# Patient Record
Sex: Male | Born: 1970 | Race: White | Hispanic: No | Marital: Married | Smoking: Never smoker
Health system: Southern US, Community
[De-identification: ages and names within clinical notes are randomized; demographics above are authoritative.]

## PROBLEM LIST (undated history)

## (undated) DIAGNOSIS — J302 Other seasonal allergic rhinitis: Secondary | ICD-10-CM

## (undated) DIAGNOSIS — R079 Chest pain, unspecified: Secondary | ICD-10-CM

## (undated) DIAGNOSIS — I10 Essential (primary) hypertension: Secondary | ICD-10-CM

## (undated) HISTORY — DX: Chest pain, unspecified: R07.9

## (undated) HISTORY — DX: Other seasonal allergic rhinitis: J30.2

## (undated) HISTORY — PX: TONSILLECTOMY: SUR1361

## (undated) HISTORY — DX: Essential (primary) hypertension: I10

---

## 2019-09-02 DIAGNOSIS — E559 Vitamin D deficiency, unspecified: Secondary | ICD-10-CM | POA: Insufficient documentation

## 2019-12-05 ENCOUNTER — Other Ambulatory Visit: Payer: Self-pay | Admitting: Gastroenterology

## 2019-12-05 DIAGNOSIS — R748 Abnormal levels of other serum enzymes: Secondary | ICD-10-CM

## 2019-12-10 ENCOUNTER — Other Ambulatory Visit: Payer: Self-pay

## 2019-12-10 ENCOUNTER — Ambulatory Visit
Admission: RE | Admit: 2019-12-10 | Discharge: 2019-12-10 | Disposition: A | Discharge status: Home / Self Care | Payer: Managed Care, Other (non HMO) | Source: Ambulatory Visit | Attending: Gastroenterology | Admitting: Gastroenterology

## 2019-12-10 DIAGNOSIS — R748 Abnormal levels of other serum enzymes: Secondary | ICD-10-CM | POA: Diagnosis not present

## 2019-12-11 ENCOUNTER — Ambulatory Visit: Payer: Self-pay

## 2020-03-01 DIAGNOSIS — L989 Disorder of the skin and subcutaneous tissue, unspecified: Secondary | ICD-10-CM | POA: Insufficient documentation

## 2020-11-17 DIAGNOSIS — M545 Low back pain, unspecified: Secondary | ICD-10-CM | POA: Insufficient documentation

## 2021-05-15 ENCOUNTER — Other Ambulatory Visit: Payer: Self-pay

## 2021-05-15 ENCOUNTER — Ambulatory Visit
Admit: 2021-05-15 | Discharge: 2021-05-15 | Disposition: A | Discharge status: Home / Self Care | Payer: Managed Care, Other (non HMO) | Source: Ambulatory Visit | Attending: Emergency Medicine | Admitting: Emergency Medicine

## 2021-05-15 DIAGNOSIS — M25562 Pain in left knee: Secondary | ICD-10-CM | POA: Insufficient documentation

## 2021-05-15 DIAGNOSIS — Z880 Allergy status to penicillin: Secondary | ICD-10-CM | POA: Diagnosis not present

## 2021-05-15 DIAGNOSIS — M25561 Pain in right knee: Secondary | ICD-10-CM | POA: Diagnosis not present

## 2021-05-15 DIAGNOSIS — J069 Acute upper respiratory infection, unspecified: Secondary | ICD-10-CM | POA: Diagnosis not present

## 2021-05-15 DIAGNOSIS — Z20822 Contact with and (suspected) exposure to covid-19: Secondary | ICD-10-CM | POA: Insufficient documentation

## 2021-05-15 DIAGNOSIS — R42 Dizziness and giddiness: Secondary | ICD-10-CM | POA: Insufficient documentation

## 2021-05-15 DIAGNOSIS — Z881 Allergy status to other antibiotic agents status: Secondary | ICD-10-CM | POA: Diagnosis not present

## 2021-05-15 DIAGNOSIS — R509 Fever, unspecified: Secondary | ICD-10-CM | POA: Insufficient documentation

## 2021-05-15 LAB — INFLUENZA A AND B ANTIGEN (CONVERTED LAB)
INFLUENZA A ANTIGEN, POC: NEGATIVE
INFLUENZA B ANTIGEN, POC: NEGATIVE

## 2021-05-15 MED ORDER — IPRATROPIUM BROMIDE 0.06 % NA SOLN: 2.0000 | Freq: Four times a day (QID) | NASAL | 12 refills | Status: DC

## 2021-05-15 MED ORDER — PROMETHAZINE-DM 6.25-15 MG/5ML PO SYRP: 5.0000 mL | ORAL_SOLUTION | Freq: Four times a day (QID) | ORAL | 0 refills | Status: DC | PRN

## 2021-05-15 MED ORDER — BENZONATATE 100 MG PO CAPS
200.0000 mg | ORAL_CAPSULE | Freq: Three times a day (TID) | ORAL | 0 refills | Status: DC
Start: 2021-05-15 — End: 2021-12-28

## 2021-05-15 NOTE — Discharge Instructions (Addendum)
Isolate at home pending the results of your COVID test.  If you test positive then you will have to quarantine for 5 days from the start of your symptoms.  After 5 days you can break quarantine if your symptoms have improved and you have not had a fever for 24 hours without taking Tylenol or ibuprofen.  Use over-the-counter Tylenol and ibuprofen as needed for body aches and fever.  Use the Tessalon Perles during the day as needed for cough and the Promethazine DM cough syrup at nighttime as will make you drowsy.  Use the Atrovent nasal spray, 2 squirts up each nostril every 6 hours, as needed for nasal congestion.  Increase your oral fluid intake to goal of a gallon a day till help improve hydration which may help with your feeling of being off balance and also with the dark-colored urine.  If you develop any increased shortness of breath-especially at rest, you are unable to speak in full sentences, or is a late sign your lips are turning blue you need to go the ER for evaluation.

## 2021-05-15 NOTE — ED Triage Notes (Signed)
Pt c/o dizziness, joint pain, fever 102, all started yesterday. Pt denies n/v/d or other symptoms. Pt does report change to his urine color, denies other urinary symptoms.

## 2021-05-15 NOTE — ED Provider Notes (Addendum)
[facility]    CSN: [id number] Arrival date & time: 05/15/21  7371      History   Chief Complaint Chief Complaint  Patient presents with   Dizziness   Fever    HPI Sergio Weeks. is a 50 y.o. male.   HPI  50 year old male here for evaluation of multiple complaints.  Patient reports that he developed dizziness, pain in his knees and ankles, and darker yellow urine that started yesterday.  He states when he left the house this morning his fever was 102.  He has not take anything for his fever and his temperature is currently 98.3 in clinic.  He states that he has had what he describes as dizziness but when asked to clarify he says this more like he his head is stopped up.  There is no room spinning or feeling of being off balance.  He is also had a nonproductive cough which she is attributing to allergies as he gets the same this time a year.  He denies any runny nose nasal congestion, sore throat, shortness breath or wheezing, or GI complaints.  He denies any painful urination or urinary urgency or frequency.  The "dizziness" is not affected by change in position or movement of his head.  Patient shakes his head up and down, and side to side, when answering questions without any exacerbation of symptoms.  History reviewed. No pertinent past medical history.  There are no problems to display for this patient.   History reviewed. No pertinent surgical history.     Home Medications    Prior to Admission medications   Medication Sig Start Date End Date Taking? Authorizing Provider  benzonatate (TESSALON) 100 MG capsule Take 2 capsules (200 mg total) by mouth every 8 (eight) hours. 05/15/21  Yes Becky Augusta, NP  Cholecalciferol 50 MCG (2000 UT) CAPS Take 3 capsules daily for 3 months, then reduce to 1 capsule daily thereafter for Vitamin D Deficiency. 10/29/20  Yes [provider]  ipratropium (ATROVENT) 0.06 % nasal spray Place 2 sprays into both  nostrils 4 (four) times daily. 05/15/21  Yes Becky Augusta, NP  losartan (COZAAR) 25 MG tablet Take 1 tablet by mouth daily. 01/20/21 01/20/22 Yes [provider]  promethazine-dextromethorphan (PROMETHAZINE-DM) 6.25-15 MG/5ML syrup Take 5 mLs by mouth 4 (four) times daily as needed. 05/15/21  Yes Becky Augusta, NP    Family History History reviewed. No pertinent family history.  Social History Social History   Tobacco Use   Smoking status: Never   Smokeless tobacco: Never  Vaping Use   Vaping Use: Never used     Allergies   Cefaclor and Penicillin v   Review of Systems Review of Systems  Constitutional:  Positive for fever. Negative for activity change and appetite change.  HENT:  Negative for congestion, rhinorrhea and sore throat.   Respiratory:  Positive for cough. Negative for shortness of breath and wheezing.   Gastrointestinal:  Negative for diarrhea, nausea and vomiting.  Genitourinary:  Negative for decreased urine volume, difficulty urinating, dysuria, frequency, hematuria and urgency.  Skin:  Negative for rash.  Neurological:  Positive for light-headedness.  Hematological: Negative.   Psychiatric/Behavioral: Negative.      Physical Exam Triage Vital Signs ED Triage Vitals  Enc Vitals Group     BP 05/15/21 0840 123/78     Pulse Rate 05/15/21 0840 94     Resp 05/15/21 0840 18     Temp 05/15/21 0840 98.3 F (  36.8 C)     Temp Source 05/15/21 0840 Oral     SpO2 05/15/21 0840 99 %     Weight 05/15/21 0838 282 lb (127.9 kg)     Height 05/15/21 0838 6\' 2"  (1.88 m)     Head Circumference --      Peak Flow --      Pain Score 05/15/21 0838 0     Pain Loc --      Pain Edu? --      Excl. in GC? --    No data found.  Updated Vital Signs BP 123/78 (BP Location: Left Arm)   Pulse 94   Temp 98.3 F (36.8 C) (Oral)   Resp 18   Ht 6\' 2"  (1.88 m)   Wt 282 lb (127.9 kg)   SpO2 99%   BMI 36.21 kg/m   Visual Acuity Right Eye Distance:   Left Eye  Distance:   Bilateral Distance:    Right Eye Near:   Left Eye Near:    Bilateral Near:     Physical Exam Vitals and nursing note reviewed.  Constitutional:      General: He is not in acute distress.    Appearance: Normal appearance. He is not ill-appearing.  HENT:     Head: Normocephalic and atraumatic.     Right Ear: Tympanic membrane, ear canal and external ear normal. There is no impacted cerumen.     Left Ear: Tympanic membrane, ear canal and external ear normal. There is no impacted cerumen.     Ears:     Comments: No tenderness or change of symptomology with palpation of bilateral eustachian tubes externally.    Nose: Congestion present. No rhinorrhea.     Mouth/Throat:     Mouth: Mucous membranes are moist.     Pharynx: Oropharynx is clear. Posterior oropharyngeal erythema present.  Eyes:     General: No scleral icterus.    Extraocular Movements: Extraocular movements intact.     Pupils: Pupils are equal, round, and reactive to light.  Cardiovascular:     Rate and Rhythm: Normal rate and regular rhythm.     Pulses: Normal pulses.     Heart sounds: Normal heart sounds. No murmur heard.   No gallop.  Pulmonary:     Breath sounds: Normal breath sounds. No wheezing, rhonchi or rales.  Musculoskeletal:     Cervical back: Normal range of motion and neck supple.  Lymphadenopathy:     Cervical: No cervical adenopathy.  Skin:    General: Skin is warm and dry.     Capillary Refill: Capillary refill takes less than 2 seconds.     Findings: No erythema or rash.  Neurological:     General: No focal deficit present.     Mental Status: He is alert and oriented to person, place, and time.  Psychiatric:        Mood and Affect: Mood normal.        Behavior: Behavior normal.        Thought Content: Thought content normal.        Judgment: Judgment normal.     UC Treatments / Results  Labs (all labs ordered are listed, but only abnormal results are displayed) Labs Reviewed   SARS CORONAVIRUS 2 (TAT 6-24 HRS)  INFLUENZA A AND B ANTIGEN (CONVERTED LAB)  POC INFLUENZA A AND B ANTIGEN (URGENT CARE ONLY)    EKG   Radiology No results found.  Procedures Procedures (including critical care time)  Medications Ordered in UC Medications - No data to display  Initial Impression / Assessment and Plan / UC Course  I have reviewed the triage vital signs and the nursing notes.  Pertinent labs & imaging results that were available during my care of the patient were reviewed by me and considered in my medical decision making (see chart for details).  50 year old male here for evaluation of flulike symptoms that began yesterday and include with the patient is calling "dizziness".  Upon further clarification this is another feeling of being off balance or room spinning.  It is not affected by change in position or movement of his head.  He just feels a little "off".  He is also complaining of his left ear feeling stopped up.  His other symptoms include pain in his knees and ankles, both which she has had previous injuries and, a nonproductive cough, and a darker yellow urine.  There is no dysuria, urgency, frequency associated with the darker urine.  Patient's physical exam reveals bilateral pearly gray tympanic membranes with a normal light reflex and clear external auditory canals.  Patient has no tenderness with external palpation of bilateral eustachian tubes.  Nasal mucosa is mildly edematous and erythematous with some dried yellow mucus in both naris but no discharge.  Oropharyngeal exam reveals mild posterior oropharyngeal erythema with clear postnasal drip.  No cervical lymphadenopathy appreciated exam.  Cardiopulmonary exam reveals clear lung sounds in all fields.  Patient states that his daughter is an Electrical engineer and she evaluated him yesterday and expressed that her concern was he has COVID.  Patient reports that his body aches feels similar to when he has had  the flu in the past.  Will swab patient for COVID and flu.  Patient symptoms are most consistent with possible COVID.  Point-of-care influenza is.  COVID swab is pending.  Will discharge patient home to isolate pending results of his COVID test.  Given the fact that patient has had a mild nonproductive cough we will give Tessalon Perles and Promethazine DM cough syrup.  We will also give Atrovent nasal spray to help with nasal congestion and this may also help with the patient's dizziness.  It is unclear that patient is in fact having dizziness but I think it is more to do with nasal congestion and upper airway inflammation.  Patient vies to follow-up in the ER if there is any change of symptoms.  Work note provided.   Final Clinical Impressions(s) / UC Diagnoses   Final diagnoses:  Upper respiratory tract infection, unspecified type     Discharge Instructions      Isolate at home pending the results of your COVID test.  If you test positive then you will have to quarantine for 5 days from the start of your symptoms.  After 5 days you can break quarantine if your symptoms have improved and you have not had a fever for 24 hours without taking Tylenol or ibuprofen.  Use over-the-counter Tylenol and ibuprofen as needed for body aches and fever.  Use the Tessalon Perles during the day as needed for cough and the Promethazine DM cough syrup at nighttime as will make you drowsy.  Use the Atrovent nasal spray, 2 squirts up each nostril every 6 hours, as needed for nasal congestion.  Increase your oral fluid intake to goal of a gallon a day till help improve hydration which may help with your feeling of being off balance and also with the dark-colored urine.  If you develop any increased shortness of breath-especially at rest, you are unable to speak in full sentences, or is a late sign your lips are turning blue you need to go the ER for evaluation.      ED Prescriptions     Medication  Sig Dispense Auth. Provider   benzonatate (TESSALON) 100 MG capsule Take 2 capsules (200 mg total) by mouth every 8 (eight) hours. 21 capsule Becky Augusta, NP   ipratropium (ATROVENT) 0.06 % nasal spray Place 2 sprays into both nostrils 4 (four) times daily. 15 mL Becky Augusta, NP   promethazine-dextromethorphan (PROMETHAZINE-DM) 6.25-15 MG/5ML syrup Take 5 mLs by mouth 4 (four) times daily as needed. 118 mL Becky Augusta, NP      PDMP not reviewed this encounter.   Becky Augusta, NP 05/15/21 4540    Becky Augusta, NP 05/15/21 [phone]

## 2021-05-16 LAB — SARS CORONAVIRUS 2 (TAT 6-24 HRS): SARS Coronavirus 2: NEGATIVE

## 2021-07-15 DIAGNOSIS — G4739 Other sleep apnea: Secondary | ICD-10-CM | POA: Insufficient documentation

## 2021-07-16 DIAGNOSIS — I700 Atherosclerosis of aorta: Secondary | ICD-10-CM | POA: Insufficient documentation

## 2021-08-18 ENCOUNTER — Other Ambulatory Visit (HOSPITAL_COMMUNITY): Payer: Self-pay | Admitting: Neurosurgery

## 2021-08-18 ENCOUNTER — Other Ambulatory Visit: Payer: Self-pay | Admitting: Neurosurgery

## 2021-08-29 ENCOUNTER — Other Ambulatory Visit: Payer: Self-pay | Admitting: Neurosurgery

## 2021-08-29 DIAGNOSIS — M5136 Other intervertebral disc degeneration, lumbar region: Secondary | ICD-10-CM

## 2021-08-29 DIAGNOSIS — M5416 Radiculopathy, lumbar region: Secondary | ICD-10-CM

## 2021-09-06 ENCOUNTER — Ambulatory Visit
Admission: RE | Admit: 2021-09-06 | Discharge: 2021-09-06 | Disposition: A | Discharge status: Home / Self Care | Payer: Managed Care, Other (non HMO) | Source: Ambulatory Visit | Attending: Neurosurgery | Admitting: Neurosurgery

## 2021-09-06 ENCOUNTER — Other Ambulatory Visit: Payer: Self-pay

## 2021-09-06 DIAGNOSIS — M5416 Radiculopathy, lumbar region: Secondary | ICD-10-CM | POA: Insufficient documentation

## 2021-09-06 DIAGNOSIS — M5136 Other intervertebral disc degeneration, lumbar region: Secondary | ICD-10-CM | POA: Insufficient documentation

## 2021-11-23 DIAGNOSIS — R770 Abnormality of albumin: Secondary | ICD-10-CM | POA: Insufficient documentation

## 2021-12-07 ENCOUNTER — Other Ambulatory Visit: Payer: Self-pay | Admitting: Physician Assistant

## 2021-12-07 ENCOUNTER — Other Ambulatory Visit: Payer: Self-pay | Admitting: Gerontology

## 2021-12-07 ENCOUNTER — Other Ambulatory Visit (HOSPITAL_COMMUNITY): Payer: Self-pay | Admitting: Physician Assistant

## 2021-12-07 DIAGNOSIS — R5383 Other fatigue: Secondary | ICD-10-CM

## 2021-12-07 DIAGNOSIS — K529 Noninfective gastroenteritis and colitis, unspecified: Secondary | ICD-10-CM

## 2021-12-07 DIAGNOSIS — R634 Abnormal weight loss: Secondary | ICD-10-CM

## 2021-12-07 DIAGNOSIS — D729 Disorder of white blood cells, unspecified: Secondary | ICD-10-CM

## 2021-12-07 DIAGNOSIS — R748 Abnormal levels of other serum enzymes: Secondary | ICD-10-CM

## 2021-12-07 DIAGNOSIS — R519 Headache, unspecified: Secondary | ICD-10-CM

## 2021-12-07 DIAGNOSIS — R42 Dizziness and giddiness: Secondary | ICD-10-CM

## 2021-12-14 ENCOUNTER — Inpatient Hospital Stay: Payer: Managed Care, Other (non HMO) | Admitting: Oncology

## 2021-12-14 ENCOUNTER — Inpatient Hospital Stay: Payer: Managed Care, Other (non HMO)

## 2021-12-28 ENCOUNTER — Other Ambulatory Visit: Payer: Self-pay

## 2021-12-28 ENCOUNTER — Inpatient Hospital Stay: Payer: Managed Care, Other (non HMO)

## 2021-12-28 ENCOUNTER — Encounter: Payer: Self-pay | Admitting: Oncology

## 2021-12-28 ENCOUNTER — Inpatient Hospital Stay: Payer: Managed Care, Other (non HMO) | Attending: Oncology | Admitting: Oncology

## 2021-12-28 VITALS — BP 118/80 | HR 75 | Temp 97.8°F | Resp 18 | Wt 229.7 lb

## 2021-12-28 DIAGNOSIS — D72819 Decreased white blood cell count, unspecified: Secondary | ICD-10-CM | POA: Insufficient documentation

## 2021-12-28 DIAGNOSIS — R634 Abnormal weight loss: Secondary | ICD-10-CM | POA: Diagnosis not present

## 2021-12-28 DIAGNOSIS — R5383 Other fatigue: Secondary | ICD-10-CM | POA: Diagnosis not present

## 2021-12-28 DIAGNOSIS — D539 Nutritional anemia, unspecified: Secondary | ICD-10-CM

## 2021-12-28 DIAGNOSIS — Z833 Family history of diabetes mellitus: Secondary | ICD-10-CM | POA: Diagnosis not present

## 2021-12-28 DIAGNOSIS — D7589 Other specified diseases of blood and blood-forming organs: Secondary | ICD-10-CM | POA: Insufficient documentation

## 2021-12-28 DIAGNOSIS — Z79899 Other long term (current) drug therapy: Secondary | ICD-10-CM | POA: Insufficient documentation

## 2021-12-28 DIAGNOSIS — R748 Abnormal levels of other serum enzymes: Secondary | ICD-10-CM | POA: Diagnosis not present

## 2021-12-28 DIAGNOSIS — R6881 Early satiety: Secondary | ICD-10-CM | POA: Diagnosis not present

## 2021-12-28 LAB — CBC WITH DIFFERENTIAL/PLATELET
Abs Immature Granulocytes: 0.06 10*3/uL (ref 0.00–0.07)
Basophils Absolute: 0.1 10*3/uL (ref 0.0–0.1)
Basophils Relative: 2 %
Eosinophils Absolute: 0.3 10*3/uL (ref 0.0–0.5)
Eosinophils Relative: 4 %
HCT: 40.2 % (ref 39.0–52.0)
Hemoglobin: 13.9 g/dL (ref 13.0–17.0)
Immature Granulocytes: 1 %
Lymphocytes Relative: 22 %
Lymphs Abs: 1.3 10*3/uL (ref 0.7–4.0)
MCH: 37.7 pg — ABNORMAL HIGH (ref 26.0–34.0)
MCHC: 34.6 g/dL (ref 30.0–36.0)
MCV: 108.9 fL — ABNORMAL HIGH (ref 80.0–100.0)
Monocytes Absolute: 1 10*3/uL (ref 0.1–1.0)
Monocytes Relative: 16 %
Neutro Abs: 3.4 10*3/uL (ref 1.7–7.7)
Neutrophils Relative %: 55 %
Platelets: 150 10*3/uL (ref 150–400)
RBC: 3.69 MIL/uL — ABNORMAL LOW (ref 4.22–5.81)
RDW: 13.3 % (ref 11.5–15.5)
WBC: 6.2 10*3/uL (ref 4.0–10.5)
nRBC: 0 % (ref 0.0–0.2)

## 2021-12-28 LAB — HEPATIC FUNCTION PANEL
ALT: 42 U/L (ref 0–44)
AST: 60 U/L — ABNORMAL HIGH (ref 15–41)
Albumin: 2.6 g/dL — ABNORMAL LOW (ref 3.5–5.0)
Alkaline Phosphatase: 90 U/L (ref 38–126)
Bilirubin, Direct: 1.3 mg/dL — ABNORMAL HIGH (ref 0.0–0.2)
Indirect Bilirubin: 5.3 mg/dL — ABNORMAL HIGH (ref 0.3–0.9)
Total Bilirubin: 6.6 mg/dL — ABNORMAL HIGH (ref 0.3–1.2)
Total Protein: 6.5 g/dL (ref 6.5–8.1)

## 2021-12-28 LAB — HEPATITIS PANEL, ACUTE
HCV Ab: NONREACTIVE
Hep A IgM: NONREACTIVE
Hep B C IgM: NONREACTIVE
Hepatitis B Surface Ag: NONREACTIVE

## 2021-12-28 LAB — RETIC PANEL
Immature Retic Fract: 7.4 % (ref 2.3–15.9)
RBC.: 3.64 MIL/uL — ABNORMAL LOW (ref 4.22–5.81)
Retic Count, Absolute: 102.3 10*3/uL (ref 19.0–186.0)
Retic Ct Pct: 2.8 % (ref 0.4–3.1)
Reticulocyte Hemoglobin: 41.1 pg (ref >27.9)

## 2021-12-28 LAB — TSH: TSH: 3.904 u[IU]/mL (ref 0.350–4.500)

## 2021-12-28 LAB — FERRITIN: Ferritin: 686 ng/mL — ABNORMAL HIGH (ref 24–336)

## 2021-12-28 LAB — IRON AND TIBC
Iron: 205 ug/dL — ABNORMAL HIGH (ref 45–182)
Saturation Ratios: 92 % — ABNORMAL HIGH (ref 17.9–39.5)
TIBC: 224 ug/dL — ABNORMAL LOW (ref 250–450)
UIBC: 19 ug/dL

## 2021-12-28 LAB — LACTATE DEHYDROGENASE: LDH: 217 U/L — ABNORMAL HIGH (ref 98–192)

## 2021-12-28 NOTE — Addendum Note (Signed)
Addended by: Earlie Server on: 12/28/2021 08:38 PM   Modules accepted: Orders

## 2021-12-28 NOTE — Progress Notes (Addendum)
Hematology/Oncology Consult note [phone] [phone]         Patient Care Team: Latanya Maudlin, NP as PCP - General (Family Medicine)  REFERRING PROVIDER: Latanya Maudlin, NP  CHIEF COMPLAINTS/REASON FOR VISIT:  Evaluation of decreased white count  HISTORY OF PRESENTING ILLNESS:   Sergio Sitzer. is a  51 y.o.  male with PMH listed below was seen in consultation at the request of  Latanya Maudlin, NP  for evaluation of decreased white count  Patient was recently seen by primary care provider. 11/22/2021, CBC showed decreased WBC 3.8, hemoglobin 13.2, MCV 109.2, normal absolute lymphocyte, absolute neutrophil, increased monocyte and eosinophil percentage Ferritin level 930, iron 201, TIBC, 231, TSH 2.72, vitamin B12 929, amylase 47, lipase 71, sodium 133, potassium 4.1, creatinine 0.8, calcium 8.5, AST 85, ALT 51, alkaline phosphatase 143, albumin 2.9, bilirubin 3.8.  Patient was accompanied by his wife today.  Per wife, there is some unintentional weight loss, yellow tint to his skin,  early satiety, decreased appetite.  Patient drinks 2 to 3 cans of beer every day.  I reviewed patient's previous lab records.  Patient has had elevated bilirubin and elevated liver enzymes intermittently since at least 2021. Patient was previously seen by [facility] clinic GI.  12/10/2019, right upper quadrant ultrasound abdomen showed no focal lesion identified in the liver, parenchymal echogenicity is within normal limits.  Portal vein is patent. Patient has not had colonoscopy before. Denies fever chills, abdominal pain, frequent infection.  Denies family history of cancer.  Review of Systems  Constitutional:  Positive for appetite change, fatigue and unexpected weight change. Negative for chills and fever.  HENT:   Negative for hearing loss and voice change.   Eyes:  Negative for eye problems and icterus.  Respiratory:  Negative for chest tightness, cough and  shortness of breath.   Cardiovascular:  Negative for chest pain and leg swelling.  Gastrointestinal:  Negative for abdominal distention and abdominal pain.  Endocrine: Negative for hot flashes.  Genitourinary:  Negative for difficulty urinating, dysuria and frequency.   Musculoskeletal:  Negative for arthralgias.  Skin:  Negative for itching and rash.  Neurological:  Negative for light-headedness and numbness.  Hematological:  Negative for adenopathy. Does not bruise/bleed easily.  Psychiatric/Behavioral:  Negative for confusion.    MEDICAL HISTORY:  Past Medical History:  Diagnosis Date   Chest pain at rest    headache    Hypertension     SURGICAL HISTORY: Past Surgical History:  Procedure Laterality Date   TONSILLECTOMY      SOCIAL HISTORY: Social History   Socioeconomic History   Marital status: Married    Spouse name: Not on file   Number of children: Not on file   Years of education: Not on file   Highest education level: Not on file  Occupational History   Not on file  Tobacco Use   Smoking status: Never   Smokeless tobacco: Never  Vaping Use   Vaping Use: Never used  Substance and Sexual Activity   Alcohol use: Yes    Comment: 2-3 beer daily.   Drug use: Never   Sexual activity: Not on file  Other Topics Concern   Not on file  Social History Narrative   Not on file   Social Determinants of Health   Financial Resource Strain: Not on file  Food Insecurity: Not on file  Transportation Needs: Not on file  Physical Activity: Not on file  Stress: Not  on file  Social Connections: Not on file  Intimate Partner Violence: Not on file    FAMILY HISTORY: Family History  Problem Relation Age of Onset   Diabetes Father     ALLERGIES:  is allergic to cefaclor and penicillin v.  MEDICATIONS:  Current Outpatient Medications  Medication Sig Dispense Refill   Cholecalciferol 50 MCG (2000 UT) CAPS Take 3 capsules daily for 3 months, then reduce to 1 capsule  daily thereafter for Vitamin D Deficiency.     losartan (COZAAR) 25 MG tablet Take 1 tablet by mouth daily.     magnesium oxide (MAG-OX) 400 (240 Mg) MG tablet Take 1 tablet by mouth daily.     No current facility-administered medications for this visit.     PHYSICAL EXAMINATION: ECOG PERFORMANCE STATUS: 1 - Symptomatic but completely ambulatory Vitals:   12/28/21 0949  BP: 118/80  Pulse: 75  Resp: 18  Temp: 97.8 F (36.6 C)   Filed Weights   12/28/21 0949  Weight: 229 lb 11.2 oz (104.2 kg)    Physical Exam Constitutional:      General: He is not in acute distress. HENT:     Head: Normocephalic and atraumatic.  Eyes:     General: No scleral icterus. Cardiovascular:     Rate and Rhythm: Normal rate and regular rhythm.     Heart sounds: Normal heart sounds.  Pulmonary:     Effort: Pulmonary effort is normal. No respiratory distress.     Breath sounds: No wheezing.  Abdominal:     General: Bowel sounds are normal. There is no distension.     Palpations: Abdomen is soft.  Musculoskeletal:        General: No deformity. Normal range of motion.     Cervical back: Normal range of motion and neck supple.  Skin:    General: Skin is warm and dry.  Neurological:     Mental Status: He is alert and oriented to person, place, and time. Mental status is at baseline.     Cranial Nerves: No cranial nerve deficit.  Psychiatric:        Mood and Affect: Mood normal.    LABORATORY DATA:  I have reviewed the data as listed Lab Results  Component Value Date   WBC 6.2 12/28/2021   HGB 13.9 12/28/2021   HCT 40.2 12/28/2021   MCV 108.9 (H) 12/28/2021   PLT 150 12/28/2021   Recent Labs    12/28/21 1042  PROT 6.5  ALBUMIN 2.6*  AST 60*  ALT 42  ALKPHOS 90  BILITOT 6.6*  BILIDIR 1.3*  IBILI 5.3*   Iron/TIBC/Ferritin/ %Sat    Component Value Date/Time   IRON 205 (H) 12/28/2021 1026   TIBC 224 (L) 12/28/2021 1026   FERRITIN 686 (H) 12/28/2021 1026   IRONPCTSAT 92 (H)  12/28/2021 1026      RADIOGRAPHIC STUDIES: I have personally reviewed the radiological images as listed and agreed with the findings in the report. No results found.    ASSESSMENT & PLAN:  1. Leukopenia, unspecified type   2. Hyperbilirubinemia   3. Weight loss   4. Macrocytosis without anemia    #Leukopenia, very mild.  Unknown etiology.  He has normal vitamin B12, folate level. Check LDH, peripheral blood flow cytometry, TSH, multiple myeloma panel, light chain ratio.  #Hyperbilirubinemia, check LFT.  Hepatitis panel negative. LFT showed further increased bilirubin level to 6.6, increased direct bilirubinemia as well as increased indirect bilirubinemia. I will check additional work up  for hemolysis, Gilbert syndrome, obstruction, intrahepatic causes, Patient has CT abdomen pelvis with contrast scheduled for further evaluation.  Will ask PCP to change order to STAT.  #Macrocytosis, MCV 108.9, could be secondary to chronic alcohol use.  Check MMA  #Elevated ferritin, reactive versus secondary to acute/chronic inflammation  I will repeat iron panel. Repeat iron level showed persistent elevated ferritin of 686, iron saturation 92.  I will check hemochromatosis DNA screening panel.Marland Kitchen  #Weight loss, pending above work-up.  Orders Placed This Encounter  Procedures   Retic Panel    Standing Status:   Future    Number of Occurrences:   1    Standing Expiration Date:   12/29/2022   Multiple Myeloma Panel (SPEP&IFE w/QIG)    Standing Status:   Future    Number of Occurrences:   1    Standing Expiration Date:   12/29/2022   Kappa/lambda light chains    Standing Status:   Future    Number of Occurrences:   1    Standing Expiration Date:   12/29/2022   Flow cytometry panel-leukemia/lymphoma work-up    Standing Status:   Future    Number of Occurrences:   1    Standing Expiration Date:   12/29/2022   TSH    Standing Status:   Future    Number of Occurrences:   1    Standing  Expiration Date:   12/29/2022   Ferritin    Standing Status:   Future    Number of Occurrences:   1    Standing Expiration Date:   06/30/2022   Iron and TIBC    Standing Status:   Future    Number of Occurrences:   1    Standing Expiration Date:   12/29/2022   Hepatitis panel, acute    Standing Status:   Future    Number of Occurrences:   1    Standing Expiration Date:   12/29/2022    All questions were answered. The patient knows to call the clinic with any problems questions or concerns.  cc Latanya Maudlin, NP    Return of visit: 3 weeks to discuss results Thank you for this kind referral and the opportunity to participate in the care of this patient. A copy of today's note is routed to referring provider   Sergio Server, MD, PhD [facility] Hematology Oncology 12/28/2021

## 2021-12-29 ENCOUNTER — Telehealth: Payer: Self-pay

## 2021-12-29 ENCOUNTER — Telehealth: Payer: Self-pay | Admitting: Oncology

## 2021-12-29 LAB — KAPPA/LAMBDA LIGHT CHAINS
Kappa free light chain: 38 mg/L — ABNORMAL HIGH (ref 3.3–19.4)
Kappa, lambda light chain ratio: 1.43 (ref 0.26–1.65)
Lambda free light chains: 26.6 mg/L — ABNORMAL HIGH (ref 5.7–26.3)

## 2021-12-29 NOTE — Telephone Encounter (Signed)
Called pt to inform him of r/s ct appt inofrmation, He confirmed .Marland KitchenKJ

## 2021-12-29 NOTE — Telephone Encounter (Signed)
Pt informed and accepted lab appt for 5/30.   Called [facility] and spoke to [place]. Asked for CT to be STAT or moved up.

## 2021-12-29 NOTE — Telephone Encounter (Addendum)
Patient originally scheduled for CT on 6/1 (ordered by Laren Everts, NP), but was moved up per MD request. CT r/s to 5/26, but patient cannot do today or tomorrow. Please r/s STAT CT to early next week or original appt on 6/1 and inform pt of appt.

## 2021-12-29 NOTE — Telephone Encounter (Signed)
-----   Message from Rickard Patience, MD sent at 12/28/2021  8:47 PM EDT ----- Please let patient know that his liver bilirubin is further increased, I would like to obtain additional lab test.  Please arrange lab encounter ASAP Please contact patient's primary care provider and ask change CT abdomen pelvis to be STAT-can we get CT scan done ASAP?

## 2021-12-30 ENCOUNTER — Ambulatory Visit: Admission: RE | Admit: 2021-12-30 | Payer: Managed Care, Other (non HMO) | Source: Ambulatory Visit

## 2021-12-30 LAB — COMP PANEL: LEUKEMIA/LYMPHOMA

## 2022-01-03 ENCOUNTER — Inpatient Hospital Stay: Payer: Managed Care, Other (non HMO)

## 2022-01-03 ENCOUNTER — Ambulatory Visit
Admission: RE | Admit: 2022-01-03 | Discharge: 2022-01-03 | Disposition: A | Discharge status: Home / Self Care | Payer: Managed Care, Other (non HMO) | Source: Ambulatory Visit | Attending: Gerontology | Admitting: Gerontology

## 2022-01-03 DIAGNOSIS — K529 Noninfective gastroenteritis and colitis, unspecified: Secondary | ICD-10-CM | POA: Insufficient documentation

## 2022-01-03 DIAGNOSIS — D729 Disorder of white blood cells, unspecified: Secondary | ICD-10-CM | POA: Diagnosis present

## 2022-01-03 DIAGNOSIS — D7589 Other specified diseases of blood and blood-forming organs: Secondary | ICD-10-CM

## 2022-01-03 DIAGNOSIS — D72819 Decreased white blood cell count, unspecified: Secondary | ICD-10-CM | POA: Diagnosis not present

## 2022-01-03 DIAGNOSIS — R748 Abnormal levels of other serum enzymes: Secondary | ICD-10-CM | POA: Diagnosis present

## 2022-01-03 DIAGNOSIS — R634 Abnormal weight loss: Secondary | ICD-10-CM

## 2022-01-03 LAB — MULTIPLE MYELOMA PANEL, SERUM
Albumin SerPl Elph-Mcnc: 2.9 g/dL (ref 2.9–4.4)
Albumin/Glob SerPl: 0.9 (ref 0.7–1.7)
Alpha 1: 0.1 g/dL (ref 0.0–0.4)
Alpha2 Glob SerPl Elph-Mcnc: 0.3 g/dL — ABNORMAL LOW (ref 0.4–1.0)
B-Globulin SerPl Elph-Mcnc: 0.9 g/dL (ref 0.7–1.3)
Gamma Glob SerPl Elph-Mcnc: 2 g/dL — ABNORMAL HIGH (ref 0.4–1.8)
Globulin, Total: 3.3 g/dL (ref 2.2–3.9)
IgA: 584 mg/dL — ABNORMAL HIGH (ref 90–386)
IgG (Immunoglobin G), Serum: 1952 mg/dL — ABNORMAL HIGH (ref 603–1613)
IgM (Immunoglobulin M), Srm: 126 mg/dL (ref 20–172)
Total Protein ELP: 6.2 g/dL (ref 6.0–8.5)

## 2022-01-03 LAB — APTT: aPTT: 35 seconds (ref 24–36)

## 2022-01-03 LAB — PROTIME-INR
INR: 1.5 — ABNORMAL HIGH (ref 0.8–1.2)
Prothrombin Time: 17.7 seconds — ABNORMAL HIGH (ref 11.4–15.2)

## 2022-01-03 LAB — POCT I-STAT CREATININE: Creatinine, Ser: 0.9 mg/dL (ref 0.61–1.24)

## 2022-01-03 MED ORDER — IOHEXOL 300 MG/ML  SOLN
100.0000 mL | Freq: Once | INTRAMUSCULAR | Status: AC | PRN
  Administered 2022-01-03: 100 mL via INTRAVENOUS

## 2022-01-04 LAB — ANA W/REFLEX: Anti Nuclear Antibody (ANA): NEGATIVE

## 2022-01-04 LAB — HAPTOGLOBIN: Haptoglobin: <10 mg/dL — ABNORMAL LOW (ref 29–370)

## 2022-01-05 ENCOUNTER — Ambulatory Visit: Admission: RE | Admit: 2022-01-05 | Payer: Managed Care, Other (non HMO) | Source: Ambulatory Visit

## 2022-01-07 LAB — METHYLMALONIC ACID, SERUM: Methylmalonic Acid, Quantitative: 108 nmol/L (ref 0–378)

## 2022-01-09 LAB — HEMOCHROMATOSIS DNA-PCR(C282Y,H63D)

## 2022-01-10 LAB — MISC LABCORP TEST (SEND OUT): Labcorp test code: 511200

## 2022-01-18 ENCOUNTER — Telehealth: Payer: Self-pay | Admitting: Oncology

## 2022-01-18 ENCOUNTER — Other Ambulatory Visit: Payer: Self-pay

## 2022-01-18 ENCOUNTER — Encounter: Payer: Self-pay | Admitting: Oncology

## 2022-01-18 ENCOUNTER — Inpatient Hospital Stay: Payer: Managed Care, Other (non HMO) | Attending: Oncology | Admitting: Oncology

## 2022-01-18 ENCOUNTER — Inpatient Hospital Stay: Payer: Managed Care, Other (non HMO)

## 2022-01-18 VITALS — BP 131/91 | HR 83 | Temp 97.3°F | Ht 74.0 in | Wt 227.0 lb

## 2022-01-18 DIAGNOSIS — Z880 Allergy status to penicillin: Secondary | ICD-10-CM | POA: Insufficient documentation

## 2022-01-18 DIAGNOSIS — K7030 Alcoholic cirrhosis of liver without ascites: Secondary | ICD-10-CM | POA: Insufficient documentation

## 2022-01-18 DIAGNOSIS — Z79899 Other long term (current) drug therapy: Secondary | ICD-10-CM | POA: Insufficient documentation

## 2022-01-18 DIAGNOSIS — D72819 Decreased white blood cell count, unspecified: Secondary | ICD-10-CM | POA: Insufficient documentation

## 2022-01-18 DIAGNOSIS — D7589 Other specified diseases of blood and blood-forming organs: Secondary | ICD-10-CM | POA: Diagnosis present

## 2022-01-18 DIAGNOSIS — R161 Splenomegaly, not elsewhere classified: Secondary | ICD-10-CM | POA: Diagnosis not present

## 2022-01-18 DIAGNOSIS — K766 Portal hypertension: Secondary | ICD-10-CM | POA: Insufficient documentation

## 2022-01-18 DIAGNOSIS — Z833 Family history of diabetes mellitus: Secondary | ICD-10-CM | POA: Diagnosis not present

## 2022-01-18 DIAGNOSIS — R634 Abnormal weight loss: Secondary | ICD-10-CM | POA: Diagnosis not present

## 2022-01-18 DIAGNOSIS — Z1589 Genetic susceptibility to other disease: Secondary | ICD-10-CM | POA: Insufficient documentation

## 2022-01-18 DIAGNOSIS — K7031 Alcoholic cirrhosis of liver with ascites: Secondary | ICD-10-CM

## 2022-01-18 DIAGNOSIS — R6881 Early satiety: Secondary | ICD-10-CM | POA: Diagnosis not present

## 2022-01-18 DIAGNOSIS — R188 Other ascites: Secondary | ICD-10-CM | POA: Insufficient documentation

## 2022-01-18 DIAGNOSIS — Z881 Allergy status to other antibiotic agents status: Secondary | ICD-10-CM | POA: Insufficient documentation

## 2022-01-18 DIAGNOSIS — R5383 Other fatigue: Secondary | ICD-10-CM | POA: Diagnosis not present

## 2022-01-18 DIAGNOSIS — R7989 Other specified abnormal findings of blood chemistry: Secondary | ICD-10-CM | POA: Diagnosis not present

## 2022-01-18 LAB — DAT, POLYSPECIFIC AHG (ARMC ONLY): Polyspecific AHG test: NEGATIVE

## 2022-01-18 NOTE — Assessment & Plan Note (Signed)
CT imaging was reviewed and discussed with patient. 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 Defer to GI for further evaluation.  Patient has an appointment with [facility] clinic gastroenterology in July.

## 2022-01-18 NOTE — Assessment & Plan Note (Signed)
Labs reviewed and discussed with patient Hemolysis labs showed decreased haptoglobin, increased LDH indicating peripheral blood destruction.  No anemia, chronic macrocytosis indicating chronic hemolysis.  Check DAT, hemoglobin free plasma, G6PD, PNH, RBC osmotic fragility, for further evaluation of the etiology of hemolysis.

## 2022-01-18 NOTE — Assessment & Plan Note (Signed)
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  

## 2022-01-18 NOTE — Telephone Encounter (Signed)
Called to notify pt of 75mo fu appts, unable to LVM

## 2022-01-18 NOTE — Assessment & Plan Note (Signed)
Hemochromatosis gene mutation screening negative. Elevated ferritin is likely secondary to liver cirrhosis/alcohol use Alcohol cessation was discussed with patient. 

## 2022-01-18 NOTE — Progress Notes (Signed)
Hematology/Oncology Consult note [phone] [phone]         Patient Care Team: Luciana Axeoward, Shannon H, NP as PCP - General (Family Medicine)  REFERRING PROVIDER: Luciana Axeoward, Shannon H, NP   ASSESSMENT & PLAN:  Macrocytosis without anemia Labs reviewed and discussed with patient Hemolysis labs showed decreased haptoglobin, increased LDH indicating peripheral blood destruction.  No anemia, chronic macrocytosis indicating chronic hemolysis.  Check DAT, hemoglobin free plasma, G6PD, PNH, RBC osmotic fragility, for further evaluation of the etiology of hemolysis.  Alcoholic cirrhosis of liver (HCC) CT imaging was reviewed and discussed with patient. 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 Defer to GI for further evaluation.  [facility] July.  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 Elevated ferritin is likely secondary to liver cirrhosis/alcohol use Alcohol cessation was discussed with patient.  Orders Placed This Encounter  Procedures   Hemoglobin free, plasma    Standing Status:   Future    Number of Occurrences:   1    Standing Expiration Date:   01/19/2023   Glucose 6 phosphate dehydrogenase    Standing Status:   Future    Number of Occurrences:   1    Standing Expiration Date:   01/19/2023   PNH Profile (-High Sensitivity)    Standing Status:   Future    Number of Occurrences:   1    Standing Expiration Date:   01/19/2023   RBC Osmotic Fragility    Standing Status:   Future    Number of Occurrences:   1    Standing Expiration Date:   01/19/2023   DAT, polyspecific, AHG ([facility] only)    Standing Status:   Future    Number of Occurrences:   1    Standing Expiration Date:   01/19/2023   Follow-up in 6 months All questions were answered.  The patient knows to call the clinic with any problems, questions or concerns.  Rickard PatienceZhou Seif Teichert, MD, PhD [facility] 01/18/2022       CHIEF COMPLAINTS/REASON FOR VISIT:  Evaluation of decreased white count  HISTORY OF PRESENTING ILLNESS:   Ellin SabaRonnie C Kissling Jr. is a  51 y.o.  male with PMH listed below was seen in consultation at the request of  Luciana Axeoward, Shannon H, NP  for evaluation of decreased white count  Patient was recently seen by primary care provider. 11/22/2021, CBC showed decreased WBC 3.8, hemoglobin 13.2, MCV 109.2, normal absolute lymphocyte, absolute neutrophil, increased monocyte and eosinophil percentage Ferritin level 930, iron 201, TIBC, 231, TSH 2.72, vitamin B12 929, amylase 47, lipase 71, sodium 133, potassium 4.1, creatinine 0.8, calcium 8.5, AST 85, ALT 51, alkaline phosphatase 143, albumin 2.9, bilirubin 3.8.  Patient was accompanied by his wife today.  Per wife, there is some unintentional weight loss, yellow tint to his skin,  early satiety, decreased appetite.  Patient drinks 2 to 3 cans of beer every day.  I reviewed patient's previous lab records.  Patient has had elevated bilirubin and elevated liver enzymes intermittently since at least 2021. Patient was [facility].  12/10/2019, right upper quadrant ultrasound abdomen showed no focal lesion identified in the liver, parenchymal echogenicity is within normal limits.  Portal vein is patent. Patient has not had colonoscopy before. Denies fever chills, abdominal pain, frequent infection.  Denies  family history of cancer.  Review of Systems  Constitutional:  Positive for appetite change, fatigue and unexpected weight change. Negative for chills and fever.  HENT:   Negative for hearing loss and voice change.   Eyes:  Negative for eye problems and icterus.  Respiratory:  Negative for chest tightness, cough and shortness of breath.   Cardiovascular:  Negative for chest pain and  leg swelling.  Gastrointestinal:  Negative for abdominal distention and abdominal pain.  Endocrine: Negative for hot flashes.  Genitourinary:  Negative for difficulty urinating, dysuria and frequency.   Musculoskeletal:  Negative for arthralgias.  Skin:  Negative for itching and rash.  Neurological:  Negative for light-headedness and numbness.  Hematological:  Negative for adenopathy. Does not bruise/bleed easily.  Psychiatric/Behavioral:  Negative for confusion.     MEDICAL HISTORY:  Past Medical History:  Diagnosis Date   Chest pain at rest    headache    Hypertension     SURGICAL HISTORY: Past Surgical History:  Procedure Laterality Date   TONSILLECTOMY      SOCIAL HISTORY: Social History   Socioeconomic History   Marital status: Married    Spouse name: Not on file   Number of children: Not on file   Years of education: Not on file   Highest education level: Not on file  Occupational History   Not on file  Tobacco Use   Smoking status: Never   Smokeless tobacco: Never  Vaping Use   Vaping Use: Never used  Substance and Sexual Activity   Alcohol use: Yes    Comment: 2-3 beer daily.   Drug use: Never   Sexual activity: Not on file  Other Topics Concern   Not on file  Social History Narrative   Not on file   Social Determinants of Health   Financial Resource Strain: Not on file  Food Insecurity: Not on file  Transportation Needs: Not on file  Physical Activity: Not on file  Stress: Not on file  Social Connections: Not on file  Intimate Partner Violence: Not on file    FAMILY HISTORY: Family History  Problem Relation Age of Onset   Diabetes Father     ALLERGIES:  is allergic to cefaclor and penicillin v.  MEDICATIONS:  Current Outpatient Medications  Medication Sig Dispense Refill   Cholecalciferol 50 MCG (2000 UT) CAPS Take 3 capsules daily for 3 months, then reduce to 1 capsule daily thereafter for Vitamin D Deficiency.     losartan (COZAAR)  25 MG tablet Take 1 tablet by mouth daily.     magnesium oxide (MAG-OX) 400 (240 Mg) MG tablet Take 1 tablet by mouth daily.     No current facility-administered medications for this visit.     PHYSICAL EXAMINATION: ECOG PERFORMANCE STATUS: 1 - Symptomatic but completely ambulatory There were no vitals filed for this visit.  There were no vitals filed for this visit.   Physical Exam Constitutional:      General: He is not in acute distress. HENT:     Head: Normocephalic and atraumatic.  Eyes:     General: No scleral icterus. Cardiovascular:     Rate and Rhythm: Normal rate and regular rhythm.     Heart sounds: Normal heart sounds.  Pulmonary:     Effort: Pulmonary effort is normal. No respiratory distress.     Breath sounds: No wheezing.  Abdominal:     General: Bowel sounds are normal. There is no distension.     Palpations: Abdomen  is soft.  Musculoskeletal:        General: No deformity. Normal range of motion.     Cervical back: Normal range of motion and neck supple.  Skin:    General: Skin is warm and dry.  Neurological:     Mental Status: He is alert and oriented to person, place, and time. Mental status is at baseline.     Cranial Nerves: No cranial nerve deficit.  Psychiatric:        Mood and Affect: Mood normal.    LABORATORY DATA:  I have reviewed the data as listed Lab Results  Component Value Date   WBC 6.2 12/28/2021   HGB 13.9 12/28/2021   HCT 40.2 12/28/2021   MCV 108.9 (H) 12/28/2021   PLT 150 12/28/2021   Recent Labs    12/28/21 1042 01/03/22 1037  CREATININE  --  0.90  PROT 6.5  --   ALBUMIN 2.6*  --   AST 60*  --   ALT 42  --   ALKPHOS 90  --   BILITOT 6.6*  --   BILIDIR 1.3*  --   IBILI 5.3*  --     Iron/TIBC/Ferritin/ %Sat    Component Value Date/Time   IRON 205 (H) 12/28/2021 1026   TIBC 224 (L) 12/28/2021 1026   FERRITIN 686 (H) 12/28/2021 1026   IRONPCTSAT 92 (H) 12/28/2021 1026      RADIOGRAPHIC STUDIES: I have  personally reviewed the radiological images as listed and agreed with the findings in the report. CT ABDOMEN PELVIS W CONTRAST  Result Date: 01/03/2022 CLINICAL DATA:  20 pound weight loss and elevated liver enzymes. EXAM: CT ABDOMEN AND PELVIS WITH CONTRAST TECHNIQUE: Multidetector CT imaging of the abdomen and pelvis was performed using the standard protocol following bolus administration of intravenous contrast. RADIATION DOSE REDUCTION: This exam was performed according to the departmental dose-optimization program which includes automated exposure control, adjustment of the mA and/or kV according to patient size and/or use of iterative reconstruction technique. CONTRAST:  OMNIPAQUE IOHEXOL 300 MG/ML  SOLN COMPARISON:  Ultrasound 12/10/2019 FINDINGS: Lower chest: The lung bases are clear of acute process. No pleural effusion or pulmonary lesions. The heart is normal in size. No pericardial effusion. Age advanced coronary artery calcifications. The distal esophagus and aorta are unremarkable. Hepatobiliary: Advanced cirrhotic changes involving the liver with portal venous hypertension and portal venous collaterals. No paraesophageal varices. No worrisome hepatic lesions are identified. No intrahepatic biliary dilatation. The gallbladder is grossly normal. No common bile duct dilatation. Pancreas: No mass, inflammation or ductal dilatation. Spleen: Mild splenomegaly. Perisplenic and perigastric collateral vessels. The splenic vein is patent. Adrenals/Urinary Tract: Adrenal glands and kidneys are unremarkable. No renal lesions or hydronephrosis. The bladder is unremarkable. Stomach/Bowel: The stomach, duodenum, small bowel and colon are unremarkable. No acute inflammatory process, mass lesions or obstructive findings. The terminal ileum is normal. The appendix is not identified for certain. Vascular/Lymphatic: The aorta and branch vessels are patent. Age advanced scattered atherosclerotic calcifications.  No aneurysm dissection. The branch vessels are patent. The major venous structures are patent. Scattered mesenteric and retroperitoneal lymph nodes typical with cirrhosis. Reproductive: The prostate gland and seminal vesicles are unremarkable. Other: Moderate abdominal/pelvic ascites. Musculoskeletal: No significant bony findings. IMPRESSION: 1. Advanced cirrhotic changes involving the liver with portal venous hypertension, portal venous collaterals and mild splenomegaly. 2. No worrisome hepatic lesions. 3. Moderate abdominal/pelvic ascites. 4. Age advanced aortic and coronary artery calcifications. Electronically Signed   By: Rudie Meyer  M.D.   On: 01/03/2022 11:10

## 2022-02-01 LAB — HEMOGLOBIN FREE, PLASMA: Hgb, Plasma: 4.9 mg/dL (ref 0.0–4.9)

## 2022-02-01 LAB — RBC OSMOTIC FRAGILITY

## 2022-02-01 LAB — PNH PROFILE (-HIGH SENSITIVITY)

## 2022-05-11 ENCOUNTER — Encounter: Payer: Self-pay | Admitting: *Deleted

## 2022-05-12 ENCOUNTER — Ambulatory Visit: Payer: Managed Care, Other (non HMO) | Admitting: Anesthesiology

## 2022-05-12 ENCOUNTER — Ambulatory Visit
Admission: RE | Admit: 2022-05-12 | Discharge: 2022-05-12 | Disposition: A | Discharge status: Home / Self Care | Payer: Managed Care, Other (non HMO) | Attending: Gastroenterology | Admitting: Gastroenterology

## 2022-05-12 ENCOUNTER — Encounter
Admission: RE | Disposition: A | Discharge status: Home / Self Care | Payer: Self-pay | Source: Home / Self Care | Attending: Gastroenterology

## 2022-05-12 DIAGNOSIS — K640 First degree hemorrhoids: Secondary | ICD-10-CM | POA: Insufficient documentation

## 2022-05-12 DIAGNOSIS — I10 Essential (primary) hypertension: Secondary | ICD-10-CM | POA: Insufficient documentation

## 2022-05-12 DIAGNOSIS — K7031 Alcoholic cirrhosis of liver with ascites: Secondary | ICD-10-CM | POA: Insufficient documentation

## 2022-05-12 DIAGNOSIS — K766 Portal hypertension: Secondary | ICD-10-CM | POA: Insufficient documentation

## 2022-05-12 DIAGNOSIS — Z1211 Encounter for screening for malignant neoplasm of colon: Secondary | ICD-10-CM | POA: Diagnosis present

## 2022-05-12 DIAGNOSIS — Z79899 Other long term (current) drug therapy: Secondary | ICD-10-CM | POA: Insufficient documentation

## 2022-05-12 HISTORY — PX: ESOPHAGOGASTRODUODENOSCOPY (EGD) WITH PROPOFOL: SHX5813

## 2022-05-12 HISTORY — PX: COLONOSCOPY WITH PROPOFOL: SHX5780

## 2022-05-12 SURGERY — COLONOSCOPY WITH PROPOFOL
Anesthesia: General

## 2022-05-12 MED ORDER — PROPOFOL 10 MG/ML IV BOLUS
INTRAVENOUS | Status: DC | PRN
  Administered 2022-05-12: 50 mg via INTRAVENOUS
  Administered 2022-05-12 (×4): 70 mg via INTRAVENOUS
  Administered 2022-05-12: 50 mg via INTRAVENOUS

## 2022-05-12 MED ORDER — PROPOFOL 10 MG/ML IV BOLUS
INTRAVENOUS | Status: AC
  Filled 2022-05-12: qty 20

## 2022-05-12 MED ORDER — PROPOFOL 500 MG/50ML IV EMUL
INTRAVENOUS | Status: DC | PRN
  Administered 2022-05-12: 125 ug/kg/min via INTRAVENOUS

## 2022-05-12 MED ORDER — SODIUM CHLORIDE 0.9 % IV SOLN: INTRAVENOUS | Status: DC

## 2022-05-12 MED ORDER — LIDOCAINE HCL (CARDIAC) PF 100 MG/5ML IV SOSY
PREFILLED_SYRINGE | INTRAVENOUS | Status: DC | PRN
  Administered 2022-05-12: 40 mg via INTRAVENOUS

## 2022-05-12 NOTE — H&P (Signed)
Outpatient short stay form Pre-procedure 05/12/2022  Lesly Rubenstein, MD  Primary Physician: Latanya Maudlin, NP  Reason for visit:  Variceal screening/Colon cancer screening  History of present illness:    51 y/o gentleman with history of alcohol cirrhosis with MELD-Na of 22 and INR of 1.5 here for EGD for variceal screening and colon cancer screening. No blood thinners, no family history of GI malignancies, no significant abdominal surgeries.    Current Facility-Administered Medications:    0.9 %  sodium chloride infusion, , Intravenous, Continuous, Seaira Byus, Hilton Cork, MD, Last Rate: 20 mL/hr at 05/12/22 1240, New Bag at 05/12/22 1240  Medications Prior to Admission  Medication Sig Dispense Refill Last Dose   Cholecalciferol 50 MCG (2000 UT) CAPS Take 3 capsules daily for 3 months, then reduce to 1 capsule daily thereafter for Vitamin D Deficiency.      losartan (COZAAR) 25 MG tablet Take 1 tablet by mouth daily.      magnesium oxide (MAG-OX) 400 (240 Mg) MG tablet Take 1 tablet by mouth daily.        Allergies  Allergen Reactions   Cefaclor Rash   Penicillin V Rash     Past Medical History:  Diagnosis Date   Chest pain at rest    headache    Hypertension     Review of systems:  Otherwise negative.    Physical Exam  Gen: Alert, oriented. Appears stated age.  HEENT: PERRLA. Lungs: No respiratory distress CV: RRR Abd: soft, benign, no masses Ext: No edema    Planned procedures: Proceed with EGD/colonoscopy. The patient understands the nature of the planned procedure, indications, risks, alternatives and potential complications including but not limited to bleeding, infection, perforation, damage to internal organs and possible oversedation/side effects from anesthesia. The patient agrees and gives consent to proceed.  Please refer to procedure notes for findings, recommendations and patient disposition/instructions.     Lesly Rubenstein, MD [facility]  Gastroenterology

## 2022-05-12 NOTE — Op Note (Signed)
[facility] Gastroenterology Patient Name: Sergio Weeks Procedure Date: 05/12/2022 12:44 PM MRN: [id number] Account #: [id number] Date of Birth: November 25, 1970 Admit Type: Outpatient Age: 51 Room: [facility] ENDO ROOM 3 Gender: Male Note Status: Finalized Instrument Name: Upper Endoscope [id number] Procedure:             Upper GI endoscopy Indications:           Cirrhosis rule out esophageal varices Providers:             Andrey Farmer MD, MD Referring MD:          No Local Md, MD (Referring MD) Medicines:             Monitored Anesthesia Care Complications:         No immediate complications. Estimated blood loss:                         Minimal. Procedure:             Pre-Anesthesia Assessment:                        - Prior to the procedure, a History and Physical was                         performed, and patient medications and allergies were                         reviewed. The patient is competent. The risks and                         benefits of the procedure and the sedation options and                         risks were discussed with the patient. All questions                         were answered and informed consent was obtained.                         Patient identification and proposed procedure were                         verified by the physician, the nurse, the                         anesthesiologist, the anesthetist and the technician                         in the endoscopy suite. Mental Status Examination:                         alert and oriented. Airway Examination: normal                         oropharyngeal airway and neck mobility. Respiratory                         Examination: clear to auscultation. CV Examination:  normal. Prophylactic Antibiotics: The patient does not                         require prophylactic antibiotics. Prior                         Anticoagulants: The patient has taken no previous                          anticoagulant or antiplatelet agents. ASA Grade                         Assessment: III - A patient with severe systemic                         disease. After reviewing the risks and benefits, the                         patient was deemed in satisfactory condition to                         undergo the procedure. The anesthesia plan was to use                         monitored anesthesia care (MAC). Immediately prior to                         administration of medications, the patient was                         re-assessed for adequacy to receive sedatives. The                         heart rate, respiratory rate, oxygen saturations,                         blood pressure, adequacy of pulmonary ventilation, and                         response to care were monitored throughout the                         procedure. The physical status of the patient was                         re-assessed after the procedure.                        After obtaining informed consent, the endoscope was                         passed under direct vision. Throughout the procedure,                         the patient's blood pressure, pulse, and oxygen                         saturations were monitored continuously. The Endoscope  was introduced through the mouth, and advanced to the                         second part of duodenum. The upper GI endoscopy was                         accomplished without difficulty. The patient tolerated                         the procedure well. Findings:      There is no endoscopic evidence of varices in the entire esophagus.      Localized moderate erythema was found at the gastroesophageal junction.       Biopsies were taken with a cold forceps for histology. Estimated blood       loss: Patient continuosly oozed blood from the biopsy site requiring       treatment with placement of hemostatic clip(s). After closely watching       the  biopsy site, oozing appeared to stop.      Mild portal hypertensive gastropathy was found in the stomach.      The examined duodenum was normal. Impression:            - Erythema at the gastroesophageal junction. Biopsied.                         This was excessively oozing blood likely from                         coagulopathy so two clips were placed. Patient did                         vomit a small amount of coffee ground material.                        - Portal hypertensive gastropathy.                        - Normal examined duodenum. Recommendation:        - Await pathology results.                        - Perform a colonoscopy today. Procedure Code(s):     --- Professional ---                        [phone], Esophagogastroduodenoscopy, flexible,                         transoral; with biopsy, single or multiple Diagnosis Code(s):     --- Professional ---                        K22.8, Other specified diseases of esophagus                        K76.6, Portal hypertension                        K31.89, Other diseases of stomach and duodenum  K74.60, Unspecified cirrhosis of liver CPT copyright 2019 American Medical Association. All rights reserved. The codes documented in this report are preliminary and upon coder review may  be revised to meet current compliance requirements. Andrey Farmer MD, MD 05/12/2022 1:35:45 PM Number of Addenda: 0 Note Initiated On: 05/12/2022 12:44 PM Estimated Blood Loss:  Estimated blood loss was minimal.      [facility]

## 2022-05-12 NOTE — Transfer of Care (Signed)
Immediate Anesthesia Transfer of Care Note  Patient: Sergio Weeks.  Procedure(s) Performed: COLONOSCOPY WITH PROPOFOL ESOPHAGOGASTRODUODENOSCOPY (EGD) WITH PROPOFOL  Patient Location: PACU  Anesthesia Type:General  Level of Consciousness: awake  Airway & Oxygen Therapy: Patient Spontanous Breathing  Post-op Assessment: Report given to RN  Post vital signs: Reviewed and stable  Last Vitals:  Vitals Value Taken Time  BP 119/84 05/12/22 1335  Temp 36.3 C 05/12/22 1335  Pulse 91 05/12/22 1335  Resp 16 05/12/22 1335  SpO2      Last Pain:  Vitals:   05/12/22 1335  TempSrc: Temporal  PainSc: Asleep         Complications: No notable events documented.

## 2022-05-12 NOTE — Anesthesia Preprocedure Evaluation (Addendum)
Anesthesia Evaluation  Patient identified by MRN, date of birth, ID band Patient awake    Reviewed: Allergy & Precautions, NPO status , Patient's Chart, lab work & pertinent test results  Airway Mallampati: III  TM Distance: >3 FB Neck ROM: full    Dental no notable dental hx.    Pulmonary neg pulmonary ROS,    Pulmonary exam normal        Cardiovascular Exercise Tolerance: Good hypertension, Pt. on medications Normal cardiovascular exam     Neuro/Psych negative neurological ROS  negative psych ROS   GI/Hepatic negative GI ROS, (+) Cirrhosis       , Alcoholic Cirrhoisis of Liver with Ascites   Portal Venous HTN    Endo/Other  negative endocrine ROS  Renal/GU negative Renal ROS  negative genitourinary   Musculoskeletal   Abdominal Normal abdominal exam  (+)   Peds  Hematology negative hematology ROS (+)   Anesthesia Other Findings Past Medical History: No date: Chest pain at rest No date: headache No date: Hypertension  Past Surgical History: No date: TONSILLECTOMY     Reproductive/Obstetrics negative OB ROS                            Anesthesia Physical Anesthesia Plan  ASA: 3  Anesthesia Plan: General   Post-op Pain Management:    Induction: Intravenous  PONV Risk Score and Plan: Propofol infusion and TIVA  Airway Management Planned: Natural Airway  Additional Equipment:   Intra-op Plan:   Post-operative Plan:   Informed Consent: I have reviewed the patients History and Physical, chart, labs and discussed the procedure including the risks, benefits and alternatives for the proposed anesthesia with the patient or authorized representative who has indicated his/her understanding and acceptance.     Dental Advisory Given  Plan Discussed with: Anesthesiologist, CRNA and Surgeon  Anesthesia Plan Comments:        Anesthesia Quick Evaluation

## 2022-05-12 NOTE — Interval H&P Note (Signed)
History and Physical Interval Note:  05/12/2022 12:49 PM  Sergio Weeks.  has presented today for surgery, with the diagnosis of Alcoholic Cirrhoisis of Liver with Ascites Portal Venous HTN Colon Cancer Screening.  The various methods of treatment have been discussed with the patient and family. After consideration of risks, benefits and other options for treatment, the patient has consented to  Procedure(s): COLONOSCOPY WITH PROPOFOL (N/A) ESOPHAGOGASTRODUODENOSCOPY (EGD) WITH PROPOFOL (N/A) as a surgical intervention.  The patient's history has been reviewed, patient examined, no change in status, stable for surgery.  I have reviewed the patient's chart and labs.  Questions were answered to the patient's satisfaction.     Lesly Rubenstein  Ok to proceed with EGD/Colonoscopy

## 2022-05-12 NOTE — Op Note (Signed)
[facility] Gastroenterology Patient Name: Sergio Weeks Procedure Date: 05/12/2022 12:44 PM MRN: [id number] Account #: [id number] Date of Birth: 12-Nov-1970 Admit Type: Outpatient Age: 51 Room: [facility] ENDO ROOM 3 Gender: Male Note Status: Finalized Instrument Name: Jasper Riling [id number] Procedure:             Colonoscopy Indications:           Screening for colorectal malignant neoplasm Providers:             Andrey Farmer MD, MD Referring MD:          No Local Md, MD (Referring MD) Medicines:             Monitored Anesthesia Care Complications:         No immediate complications. Procedure:             Pre-Anesthesia Assessment:                        - Prior to the procedure, a History and Physical was                         performed, and patient medications and allergies were                         reviewed. The patient is competent. The risks and                         benefits of the procedure and the sedation options and                         risks were discussed with the patient. All questions                         were answered and informed consent was obtained.                         Patient identification and proposed procedure were                         verified by the physician, the nurse, the                         anesthesiologist, the anesthetist and the technician                         in the endoscopy suite. Mental Status Examination:                         alert and oriented. Airway Examination: normal                         oropharyngeal airway and neck mobility. Respiratory                         Examination: clear to auscultation. CV Examination:                         normal. Prophylactic Antibiotics: The patient does not  require prophylactic antibiotics. Prior                         Anticoagulants: The patient has taken no previous                         anticoagulant or antiplatelet agents. ASA  Grade                         Assessment: III - A patient with severe systemic                         disease. After reviewing the risks and benefits, the                         patient was deemed in satisfactory condition to                         undergo the procedure. The anesthesia plan was to use                         monitored anesthesia care (MAC). Immediately prior to                         administration of medications, the patient was                         re-assessed for adequacy to receive sedatives. The                         heart rate, respiratory rate, oxygen saturations,                         blood pressure, adequacy of pulmonary ventilation, and                         response to care were monitored throughout the                         procedure. The physical status of the patient was                         re-assessed after the procedure.                        After obtaining informed consent, the colonoscope was                         passed under direct vision. Throughout the procedure,                         the patient's blood pressure, pulse, and oxygen                         saturations were monitored continuously. The                         Colonoscope was introduced through the anus and  advanced to the the cecum, identified by appendiceal                         orifice and ileocecal valve. The colonoscopy was                         performed without difficulty. The patient tolerated                         the procedure well. The quality of the bowel                         preparation was fair. Findings:      The perianal and digital rectal examinations were normal.      Internal hemorrhoids were found during retroflexion. The hemorrhoids       were Grade I (internal hemorrhoids that do not prolapse).      The exam was otherwise without abnormality on direct and retroflexion       views. Impression:            -  Preparation of the colon was fair.                        - Internal hemorrhoids.                        - The examination was otherwise normal on direct and                         retroflexion views.                        - No specimens collected. Recommendation:        - Discharge patient to home.                        - Resume previous diet.                        - Continue present medications.                        - Repeat colonoscopy in 1-2 years because the bowel                         preparation was suboptimal.                        - Return to referring physician as previously                         scheduled. Procedure Code(s):     --- Professional ---                        J6283, Colorectal cancer screening; colonoscopy on                         individual not meeting criteria for high risk Diagnosis Code(s):     --- Professional ---  Z12.11, Encounter for screening for malignant neoplasm                         of colon                        K64.0, First degree hemorrhoids CPT copyright 2019 [employer]. All rights reserved. The codes documented in this report are preliminary and upon coder review may  be revised to meet current compliance requirements. Andrey Farmer MD, MD 05/12/2022 1:43:43 PM Number of Addenda: 0 Note Initiated On: 05/12/2022 12:44 PM Scope Withdrawal Time: 0 hours 5 minutes 26 seconds  Total Procedure Duration: 0 hours 12 minutes 40 seconds  Estimated Blood Loss:  Estimated blood loss: none.      [facility]

## 2022-05-12 NOTE — Anesthesia Postprocedure Evaluation (Signed)
Anesthesia Post Note  Patient: Sergio Weeks.  Procedure(s) Performed: COLONOSCOPY WITH PROPOFOL ESOPHAGOGASTRODUODENOSCOPY (EGD) WITH PROPOFOL  Patient location during evaluation: Endoscopy Anesthesia Type: General Level of consciousness: awake and alert Pain management: pain level controlled Vital Signs Assessment: post-procedure vital signs reviewed and stable Respiratory status: spontaneous breathing, nonlabored ventilation and respiratory function stable Cardiovascular status: blood pressure returned to baseline and stable Postop Assessment: no apparent nausea or vomiting Anesthetic complications: no   No notable events documented.   Last Vitals:  Vitals:   05/12/22 1415 05/12/22 1426  BP: 126/82 126/87  Pulse: 88 82  Resp: 20 20  Temp:    SpO2: 97% 97%    Last Pain:  Vitals:   05/12/22 1426  TempSrc:   PainSc: 0-No pain                 [place] Ouch

## 2022-05-15 ENCOUNTER — Encounter: Payer: Self-pay | Admitting: Gastroenterology

## 2022-05-15 LAB — SURGICAL PATHOLOGY

## 2022-05-17 ENCOUNTER — Other Ambulatory Visit: Payer: Self-pay | Admitting: Gastroenterology

## 2022-05-17 DIAGNOSIS — K7031 Alcoholic cirrhosis of liver with ascites: Secondary | ICD-10-CM

## 2022-06-02 ENCOUNTER — Ambulatory Visit
Admission: RE | Admit: 2022-06-02 | Discharge: 2022-06-02 | Disposition: A | Discharge status: Home / Self Care | Payer: Managed Care, Other (non HMO) | Source: Ambulatory Visit | Attending: Gastroenterology | Admitting: Gastroenterology

## 2022-06-02 DIAGNOSIS — K7031 Alcoholic cirrhosis of liver with ascites: Secondary | ICD-10-CM | POA: Insufficient documentation

## 2022-07-20 ENCOUNTER — Ambulatory Visit: Payer: Managed Care, Other (non HMO) | Admitting: Oncology

## 2022-07-20 ENCOUNTER — Other Ambulatory Visit: Payer: Managed Care, Other (non HMO)

## 2022-08-16 ENCOUNTER — Inpatient Hospital Stay: Payer: Managed Care, Other (non HMO) | Attending: Oncology

## 2022-08-16 ENCOUNTER — Inpatient Hospital Stay: Payer: Managed Care, Other (non HMO) | Admitting: Oncology

## 2022-08-16 VITALS — BP 127/83 | HR 90 | Temp 98.1°F | Resp 18 | Wt 230.8 lb

## 2022-08-16 DIAGNOSIS — R6881 Early satiety: Secondary | ICD-10-CM | POA: Insufficient documentation

## 2022-08-16 DIAGNOSIS — K7031 Alcoholic cirrhosis of liver with ascites: Secondary | ICD-10-CM

## 2022-08-16 DIAGNOSIS — Z880 Allergy status to penicillin: Secondary | ICD-10-CM | POA: Diagnosis not present

## 2022-08-16 DIAGNOSIS — Z1589 Genetic susceptibility to other disease: Secondary | ICD-10-CM

## 2022-08-16 DIAGNOSIS — R7989 Other specified abnormal findings of blood chemistry: Secondary | ICD-10-CM | POA: Diagnosis not present

## 2022-08-16 DIAGNOSIS — Z881 Allergy status to other antibiotic agents status: Secondary | ICD-10-CM | POA: Insufficient documentation

## 2022-08-16 DIAGNOSIS — D7589 Other specified diseases of blood and blood-forming organs: Secondary | ICD-10-CM

## 2022-08-16 DIAGNOSIS — Z833 Family history of diabetes mellitus: Secondary | ICD-10-CM | POA: Insufficient documentation

## 2022-08-16 DIAGNOSIS — Z79899 Other long term (current) drug therapy: Secondary | ICD-10-CM | POA: Insufficient documentation

## 2022-08-16 DIAGNOSIS — K7030 Alcoholic cirrhosis of liver without ascites: Secondary | ICD-10-CM | POA: Diagnosis not present

## 2022-08-16 LAB — COMPREHENSIVE METABOLIC PANEL
ALT: 46 U/L — ABNORMAL HIGH (ref 0–44)
AST: 81 U/L — ABNORMAL HIGH (ref 15–41)
Albumin: 2.6 g/dL — ABNORMAL LOW (ref 3.5–5.0)
Alkaline Phosphatase: 109 U/L (ref 38–126)
Anion gap: 7 (ref 5–15)
BUN: 12 mg/dL (ref 6–20)
CO2: 25 mmol/L (ref 22–32)
Calcium: 8.4 mg/dL — ABNORMAL LOW (ref 8.9–10.3)
Chloride: 99 mmol/L (ref 98–111)
Creatinine, Ser: 0.7 mg/dL (ref 0.61–1.24)
GFR, Estimated: >60 mL/min (ref >60)
Glucose, Bld: 134 mg/dL — ABNORMAL HIGH (ref 70–99)
Potassium: 3.7 mmol/L (ref 3.5–5.1)
Sodium: 131 mmol/L — ABNORMAL LOW (ref 135–145)
Total Bilirubin: 6.4 mg/dL — ABNORMAL HIGH (ref 0.3–1.2)
Total Protein: 6.9 g/dL (ref 6.5–8.1)

## 2022-08-16 LAB — IRON AND TIBC
Iron: 216 ug/dL — ABNORMAL HIGH (ref 45–182)
Saturation Ratios: 86 % — ABNORMAL HIGH (ref 17.9–39.5)
TIBC: 252 ug/dL (ref 250–450)
UIBC: 36 ug/dL

## 2022-08-16 LAB — CBC WITH DIFFERENTIAL/PLATELET
Abs Immature Granulocytes: 0.05 10*3/uL (ref 0.00–0.07)
Basophils Absolute: 0.1 10*3/uL (ref 0.0–0.1)
Basophils Relative: 1 %
Eosinophils Absolute: 0.1 10*3/uL (ref 0.0–0.5)
Eosinophils Relative: 1 %
HCT: 35.1 % — ABNORMAL LOW (ref 39.0–52.0)
Hemoglobin: 12.5 g/dL — ABNORMAL LOW (ref 13.0–17.0)
Immature Granulocytes: 1 %
Lymphocytes Relative: 18 %
Lymphs Abs: 0.9 10*3/uL (ref 0.7–4.0)
MCH: 38.5 pg — ABNORMAL HIGH (ref 26.0–34.0)
MCHC: 35.6 g/dL (ref 30.0–36.0)
MCV: 108 fL — ABNORMAL HIGH (ref 80.0–100.0)
Monocytes Absolute: 0.8 10*3/uL (ref 0.1–1.0)
Monocytes Relative: 16 %
Neutro Abs: 3.1 10*3/uL (ref 1.7–7.7)
Neutrophils Relative %: 63 %
Platelets: 142 10*3/uL — ABNORMAL LOW (ref 150–400)
RBC: 3.25 MIL/uL — ABNORMAL LOW (ref 4.22–5.81)
RDW: 13.7 % (ref 11.5–15.5)
WBC: 5 10*3/uL (ref 4.0–10.5)
nRBC: 0 % (ref 0.0–0.2)

## 2022-08-16 LAB — FERRITIN: Ferritin: 866 ng/mL — ABNORMAL HIGH (ref 24–336)

## 2022-08-16 LAB — RETIC PANEL
Immature Retic Fract: 5.1 % (ref 2.3–15.9)
RBC.: 3.25 MIL/uL — ABNORMAL LOW (ref 4.22–5.81)
Retic Count, Absolute: 145.9 10*3/uL (ref 19.0–186.0)
Retic Ct Pct: 4.5 % — ABNORMAL HIGH (ref 0.4–3.1)
Reticulocyte Hemoglobin: 40 pg (ref >27.9)

## 2022-08-16 NOTE — Assessment & Plan Note (Addendum)
Labs reviewed and discussed with patient Hemolysis labs showed decreased haptoglobin, increased LDH indicating peripheral blood destruction.  No anemia, chronic macrocytosis indicating chronic hemolysis.  Negative DAT, normal hemoglobin free plasma, normal G6PD, negative PNH, positive RBC osmotic fragility, I suspect that he has spherocytosis, possible hereditary spherocytosis, or due to chronic alcohol use.  EMA binding is not available at [facility]. He is not interested in NGS testing.  Stable hemoglobin.  Continue monitor.

## 2022-08-18 ENCOUNTER — Encounter: Payer: Self-pay | Admitting: Oncology

## 2022-08-18 NOTE — Progress Notes (Signed)
Hematology/Oncology Consult note [phone] [phone]         Patient Care Team: Latanya Maudlin, NP as PCP - General (Family Medicine)  REFERRING PROVIDER: Latanya Maudlin, NP   ASSESSMENT & PLAN:  Macrocytosis without anemia Labs reviewed and discussed with patient Hemolysis labs showed decreased haptoglobin, increased LDH indicating peripheral blood destruction.  No anemia, chronic macrocytosis indicating chronic hemolysis.  Negative DAT, normal hemoglobin free plasma, normal G6PD, negative PNH, positive RBC osmotic fragility, I suspect that he has spherocytosis, possible hereditary spherocytosis, or due to chronic alcohol use.  EMA binding is not available at [place]. He is not interested in NGS testing.  Stable hemoglobin.  Continue monitor.      Elevated ferritin Hemochromatosis gene mutation screening negative. Elevated ferritin is likely secondary to liver cirrhosis/alcohol use Alcohol cessation was discussed with patient.  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   Alcoholic cirrhosis of liver ([place]) Follow up with GI Orders Placed This Encounter  Procedures   CBC with Differential/Platelet    Standing Status:   Future    Standing Expiration Date:   08/16/2023   Comprehensive metabolic panel    Standing Status:   Future    Standing Expiration Date:   08/16/2023   Vitamin B12    Standing Status:   Future    Standing Expiration Date:   08/17/2023   Folate    Standing Status:   Future    Standing Expiration Date:   08/17/2023   Lactate dehydrogenase    Standing Status:   Future    Standing Expiration Date:   08/17/2023   Haptoglobin    Standing Status:   Future    Standing Expiration Date:   08/17/2023   Follow-up in 6 months All questions were answered. The patient knows to call the clinic with any problems, questions or concerns.  Earlie Server, MD, PhD [facility] Hematology  Oncology 08/16/2022       CHIEF COMPLAINTS/REASON FOR VISIT:  Evaluation of decreased white count  HISTORY OF PRESENTING ILLNESS:   Sergio Jabs. is a  52 y.o.  male with PMH listed below was seen in consultation at the request of  Latanya Maudlin, NP  for evaluation of decreased white count  Patient was recently seen by primary care provider. 11/22/2021, CBC showed decreased WBC 3.8, hemoglobin 13.2, MCV 109.2, normal absolute lymphocyte, absolute neutrophil, increased monocyte and eosinophil percentage Ferritin level 930, iron 201, TIBC, 231, TSH 2.72, vitamin B12 929, amylase 47, lipase 71, sodium 133, potassium 4.1, creatinine 0.8, calcium 8.5, AST 85, ALT 51, alkaline phosphatase 143, albumin 2.9, bilirubin 3.8.  Patient was accompanied by his wife today.  Per wife, there is some unintentional weight loss, yellow tint to his skin,  early satiety, decreased appetite.  Patient drinks 2 to 3 cans of beer every day.  I reviewed patient's previous lab records.  Patient has had elevated bilirubin and elevated liver enzymes intermittently since at least 2021. Patient was previously seen by [facility] GI.  12/10/2019, right upper quadrant ultrasound abdomen showed no focal lesion identified in the liver, parenchymal echogenicity is within normal limits.  Portal vein is patent. Patient has not had colonoscopy before. Denies fever chills, abdominal pain, frequent infection.  Denies family history of cancer.  Review of Systems  Constitutional:  Positive for appetite change and fatigue. Negative for chills, fever and unexpected weight change.  HENT:   Negative for hearing  loss and voice change.   Eyes:  Negative for eye problems and icterus.  Respiratory:  Negative for chest tightness, cough and shortness of breath.   Cardiovascular:  Negative for chest pain and leg swelling.  Gastrointestinal:  Negative for abdominal distention and abdominal pain.  Endocrine: Negative for hot  flashes.  Genitourinary:  Negative for difficulty urinating, dysuria and frequency.   Musculoskeletal:  Negative for arthralgias.  Skin:  Negative for itching and rash.  Neurological:  Negative for light-headedness and numbness.  Hematological:  Negative for adenopathy. Does not bruise/bleed easily.  Psychiatric/Behavioral:  Negative for confusion.     MEDICAL HISTORY:  Past Medical History:  Diagnosis Date   Chest pain at rest    headache    Hypertension     SURGICAL HISTORY: Past Surgical History:  Procedure Laterality Date   COLONOSCOPY WITH PROPOFOL N/A 05/12/2022   Procedure: COLONOSCOPY WITH PROPOFOL;  Surgeon: Regis Bill, MD;  Location: [facility] ENDOSCOPY;  Service: Endoscopy;  Laterality: N/A;   ESOPHAGOGASTRODUODENOSCOPY (EGD) WITH PROPOFOL N/A 05/12/2022   Procedure: ESOPHAGOGASTRODUODENOSCOPY (EGD) WITH PROPOFOL;  Surgeon: Regis Bill, MD;  Location: [facility] ENDOSCOPY;  Service: Endoscopy;  Laterality: N/A;   TONSILLECTOMY      SOCIAL HISTORY: Social History   Socioeconomic History   Marital status: Married    Spouse name: Not on file   Number of children: Not on file   Years of education: Not on file   Highest education level: Not on file  Occupational History   Not on file  Tobacco Use   Smoking status: Never   Smokeless tobacco: Never  Vaping Use   Vaping Use: Never used  Substance and Sexual Activity   Alcohol use: Yes    Comment: 2-3 beer daily.   Drug use: Never   Sexual activity: Not on file  Other Topics Concern   Not on file  Social History Narrative   Not on file   Social Determinants of Health   Financial Resource Strain: Not on file  Food Insecurity: Not on file  Transportation Needs: Not on file  Physical Activity: Not on file  Stress: Not on file  Social Connections: Not on file  Intimate Partner Violence: Not on file    FAMILY HISTORY: Family History  Problem Relation Age of Onset   Diabetes Father     ALLERGIES:   is allergic to cefaclor and penicillin v.  MEDICATIONS:  Current Outpatient Medications  Medication Sig Dispense Refill   Cholecalciferol 50 MCG (2000 UT) CAPS Take 3 capsules daily for 3 months, then reduce to 1 capsule daily thereafter for Vitamin D Deficiency.     losartan (COZAAR) 25 MG tablet Take 1 tablet by mouth daily.     magnesium oxide (MAG-OX) 400 (240 Mg) MG tablet Take 1 tablet by mouth daily.     No current facility-administered medications for this visit.     PHYSICAL EXAMINATION: ECOG PERFORMANCE STATUS: 1 - Symptomatic but completely ambulatory Vitals:   08/16/22 1443  BP: 127/83  Pulse: 90  Resp: 18  Temp: 98.1 F (36.7 C)    Filed Weights   08/16/22 1443  Weight: 230 lb 12.8 oz (104.7 kg)     Physical Exam Constitutional:      General: He is not in acute distress. HENT:     Head: Normocephalic and atraumatic.  Eyes:     General: No scleral icterus. Cardiovascular:     Rate and Rhythm: Normal rate and regular rhythm.  Heart sounds: Normal heart sounds.  Pulmonary:     Effort: Pulmonary effort is normal. No respiratory distress.     Breath sounds: No wheezing.  Abdominal:     General: Bowel sounds are normal. There is no distension.     Palpations: Abdomen is soft.  Musculoskeletal:        General: No deformity. Normal range of motion.     Cervical back: Normal range of motion and neck supple.  Skin:    General: Skin is warm and dry.  Neurological:     Mental Status: He is alert and oriented to person, place, and time. Mental status is at baseline.     Cranial Nerves: No cranial nerve deficit.  Psychiatric:        Mood and Affect: Mood normal.     LABORATORY DATA:  I have reviewed the data as listed Lab Results  Component Value Date   WBC 5.0 08/16/2022   HGB 12.5 (L) 08/16/2022   HCT 35.1 (L) 08/16/2022   MCV 108.0 (H) 08/16/2022   PLT 142 (L) 08/16/2022   Recent Labs    12/28/21 1042 01/03/22 1037 08/16/22 1420  NA  --    --  131*  K  --   --  3.7  CL  --   --  99  CO2  --   --  25  GLUCOSE  --   --  134*  BUN  --   --  12  CREATININE  --  0.90 0.70  CALCIUM  --   --  8.4*  GFRNONAA  --   --  >60  PROT 6.5  --  6.9  ALBUMIN 2.6*  --  2.6*  AST 60*  --  81*  ALT 42  --  46*  ALKPHOS 90  --  109  BILITOT 6.6*  --  6.4*  BILIDIR 1.3*  --   --   IBILI 5.3*  --   --     Iron/TIBC/Ferritin/ %Sat    Component Value Date/Time   IRON 216 (H) 08/16/2022 1420   TIBC 252 08/16/2022 1420   FERRITIN 866 (H) 08/16/2022 1420   IRONPCTSAT 86 (H) 08/16/2022 1420      RADIOGRAPHIC STUDIES: I have personally reviewed the radiological images as listed and agreed with the findings in the report. No results found.

## 2022-08-18 NOTE — Assessment & Plan Note (Signed)
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

## 2022-08-18 NOTE — Assessment & Plan Note (Signed)
Follow-up with GI 

## 2022-08-18 NOTE — Assessment & Plan Note (Signed)
Hemochromatosis gene mutation screening negative. Elevated ferritin is likely secondary to liver cirrhosis/alcohol use Alcohol cessation was discussed with patient.

## 2022-09-01 ENCOUNTER — Other Ambulatory Visit: Payer: Self-pay | Admitting: Gastroenterology

## 2022-09-01 DIAGNOSIS — K766 Portal hypertension: Secondary | ICD-10-CM

## 2022-09-01 DIAGNOSIS — K7031 Alcoholic cirrhosis of liver with ascites: Secondary | ICD-10-CM

## 2022-10-23 ENCOUNTER — Other Ambulatory Visit: Payer: Self-pay | Admitting: Gastroenterology

## 2022-10-23 DIAGNOSIS — K766 Portal hypertension: Secondary | ICD-10-CM

## 2022-10-23 DIAGNOSIS — D689 Coagulation defect, unspecified: Secondary | ICD-10-CM

## 2022-10-23 DIAGNOSIS — K7031 Alcoholic cirrhosis of liver with ascites: Secondary | ICD-10-CM

## 2022-11-08 ENCOUNTER — Ambulatory Visit
Admission: RE | Admit: 2022-11-08 | Discharge: 2022-11-08 | Disposition: A | Discharge status: Home / Self Care | Payer: Managed Care, Other (non HMO) | Source: Ambulatory Visit | Attending: Gastroenterology | Admitting: Gastroenterology

## 2022-11-08 DIAGNOSIS — D689 Coagulation defect, unspecified: Secondary | ICD-10-CM | POA: Insufficient documentation

## 2022-11-08 DIAGNOSIS — K7031 Alcoholic cirrhosis of liver with ascites: Secondary | ICD-10-CM | POA: Diagnosis present

## 2022-11-08 DIAGNOSIS — K766 Portal hypertension: Secondary | ICD-10-CM | POA: Insufficient documentation

## 2022-11-08 MED ORDER — GADOBUTROL 1 MMOL/ML IV SOLN
10.0000 mL | Freq: Once | INTRAVENOUS | Status: AC | PRN
  Administered 2022-11-08: 10 mL via INTRAVENOUS

## 2022-11-16 DIAGNOSIS — I10 Essential (primary) hypertension: Secondary | ICD-10-CM | POA: Insufficient documentation

## 2022-11-16 DIAGNOSIS — Z720 Tobacco use: Secondary | ICD-10-CM | POA: Insufficient documentation

## 2022-11-16 DIAGNOSIS — G629 Polyneuropathy, unspecified: Secondary | ICD-10-CM | POA: Insufficient documentation

## 2023-02-14 ENCOUNTER — Inpatient Hospital Stay: Payer: Managed Care, Other (non HMO)

## 2023-02-14 ENCOUNTER — Inpatient Hospital Stay: Payer: Managed Care, Other (non HMO) | Admitting: Oncology

## 2023-03-02 ENCOUNTER — Inpatient Hospital Stay: Payer: Managed Care, Other (non HMO) | Attending: Oncology

## 2023-03-02 ENCOUNTER — Inpatient Hospital Stay: Payer: Managed Care, Other (non HMO) | Admitting: Oncology

## 2023-03-02 ENCOUNTER — Other Ambulatory Visit: Payer: Self-pay

## 2023-03-02 ENCOUNTER — Telehealth: Payer: Self-pay

## 2023-03-02 ENCOUNTER — Encounter: Payer: Self-pay | Admitting: Oncology

## 2023-03-02 VITALS — BP 112/81 | HR 80 | Resp 18 | Ht 74.0 in | Wt 229.0 lb

## 2023-03-02 DIAGNOSIS — D7589 Other specified diseases of blood and blood-forming organs: Secondary | ICD-10-CM

## 2023-03-02 DIAGNOSIS — Z881 Allergy status to other antibiotic agents status: Secondary | ICD-10-CM | POA: Diagnosis not present

## 2023-03-02 DIAGNOSIS — K7030 Alcoholic cirrhosis of liver without ascites: Secondary | ICD-10-CM | POA: Diagnosis not present

## 2023-03-02 DIAGNOSIS — R634 Abnormal weight loss: Secondary | ICD-10-CM | POA: Diagnosis not present

## 2023-03-02 DIAGNOSIS — Z880 Allergy status to penicillin: Secondary | ICD-10-CM | POA: Insufficient documentation

## 2023-03-02 DIAGNOSIS — J302 Other seasonal allergic rhinitis: Secondary | ICD-10-CM | POA: Insufficient documentation

## 2023-03-02 DIAGNOSIS — R7989 Other specified abnormal findings of blood chemistry: Secondary | ICD-10-CM | POA: Diagnosis not present

## 2023-03-02 DIAGNOSIS — R6881 Early satiety: Secondary | ICD-10-CM | POA: Insufficient documentation

## 2023-03-02 DIAGNOSIS — Z833 Family history of diabetes mellitus: Secondary | ICD-10-CM | POA: Insufficient documentation

## 2023-03-02 DIAGNOSIS — K7031 Alcoholic cirrhosis of liver with ascites: Secondary | ICD-10-CM

## 2023-03-02 DIAGNOSIS — Z1589 Genetic susceptibility to other disease: Secondary | ICD-10-CM | POA: Diagnosis not present

## 2023-03-02 DIAGNOSIS — Z9089 Acquired absence of other organs: Secondary | ICD-10-CM | POA: Insufficient documentation

## 2023-03-02 DIAGNOSIS — Z79899 Other long term (current) drug therapy: Secondary | ICD-10-CM | POA: Insufficient documentation

## 2023-03-02 DIAGNOSIS — I10 Essential (primary) hypertension: Secondary | ICD-10-CM | POA: Insufficient documentation

## 2023-03-02 LAB — CBC WITH DIFFERENTIAL/PLATELET
Abs Immature Granulocytes: 0.02 10*3/uL (ref 0.00–0.07)
Basophils Absolute: 0.1 10*3/uL (ref 0.0–0.1)
Basophils Relative: 2 %
Eosinophils Absolute: 0.2 10*3/uL (ref 0.0–0.5)
Eosinophils Relative: 3 %
HCT: 38.6 % — ABNORMAL LOW (ref 39.0–52.0)
Hemoglobin: 13.5 g/dL (ref 13.0–17.0)
Immature Granulocytes: 0 %
Lymphocytes Relative: 20 %
Lymphs Abs: 0.9 10*3/uL (ref 0.7–4.0)
MCH: 37.5 pg — ABNORMAL HIGH (ref 26.0–34.0)
MCHC: 35 g/dL (ref 30.0–36.0)
MCV: 107.2 fL — ABNORMAL HIGH (ref 80.0–100.0)
Monocytes Absolute: 0.9 10*3/uL (ref 0.1–1.0)
Monocytes Relative: 19 %
Neutro Abs: 2.5 10*3/uL (ref 1.7–7.7)
Neutrophils Relative %: 56 %
Platelets: 145 10*3/uL — ABNORMAL LOW (ref 150–400)
RBC: 3.6 MIL/uL — ABNORMAL LOW (ref 4.22–5.81)
RDW: 13.4 % (ref 11.5–15.5)
WBC: 4.5 10*3/uL (ref 4.0–10.5)
nRBC: 0 % (ref 0.0–0.2)

## 2023-03-02 LAB — COMPREHENSIVE METABOLIC PANEL
ALT: 40 U/L (ref 0–44)
AST: 69 U/L — ABNORMAL HIGH (ref 15–41)
Albumin: 2.6 g/dL — ABNORMAL LOW (ref 3.5–5.0)
Alkaline Phosphatase: 124 U/L (ref 38–126)
Anion gap: 5 (ref 5–15)
BUN: 10 mg/dL (ref 6–20)
CO2: 27 mmol/L (ref 22–32)
Calcium: 8.2 mg/dL — ABNORMAL LOW (ref 8.9–10.3)
Chloride: 102 mmol/L (ref 98–111)
Creatinine, Ser: 0.74 mg/dL (ref 0.61–1.24)
GFR, Estimated: >60 mL/min (ref >60)
Glucose, Bld: 120 mg/dL — ABNORMAL HIGH (ref 70–99)
Potassium: 3.8 mmol/L (ref 3.5–5.1)
Sodium: 134 mmol/L — ABNORMAL LOW (ref 135–145)
Total Bilirubin: 5.1 mg/dL (ref 0.3–1.2)
Total Protein: 7.3 g/dL (ref 6.5–8.1)

## 2023-03-02 LAB — LACTATE DEHYDROGENASE: LDH: 258 U/L — ABNORMAL HIGH (ref 98–192)

## 2023-03-02 LAB — FOLATE: Folate: 19.6 ng/mL (ref >5.9)

## 2023-03-02 LAB — BILIRUBIN, DIRECT: Bilirubin, Direct: 1.4 mg/dL — ABNORMAL HIGH (ref 0.0–0.2)

## 2023-03-02 LAB — VITAMIN B12: Vitamin B-12: 945 pg/mL — ABNORMAL HIGH (ref 180–914)

## 2023-03-02 NOTE — Assessment & Plan Note (Signed)
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

## 2023-03-02 NOTE — Progress Notes (Addendum)
Hematology/Oncology Progress note [phone] [phone]            Patient Care Team: Luciana Axe, NP as PCP - General (Family Medicine)  REFERRING PROVIDER: Luciana Axe, NP   ASSESSMENT & PLAN:  Macrocytosis without anemia Labs reviewed and discussed with patient Hemolysis labs showed decreased haptoglobin, increased LDH indicating peripheral blood destruction.  No anemia, chronic macrocytosis indicating chronic hemolysis.  Negative DAT, normal hemoglobin free plasma, normal G6PD, negative PNH, positive RBC osmotic fragility, I suspect that he has spherocytosis, possible hereditary spherocytosis, or due to chronic alcohol use.  EMA binding is not available at [facility]. He is not interested in NGS testing.  Stable hemoglobin.  Continue monitor.      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   Elevated ferritin Hemochromatosis gene mutation screening negative. Elevated ferritin is likely secondary to liver cirrhosis/alcohol use Alcohol cessation was discussed with patient. Orders Placed This Encounter  Procedures   CBC with Differential (Cancer Center Only)    Standing Status:   Future    Standing Expiration Date:   03/01/2024   CMP (Cancer Center only)    Standing Status:   Future    Standing Expiration Date:   03/01/2024   Vitamin B12    Standing Status:   Future    Standing Expiration Date:   03/01/2024   Folate    Standing Status:   Future    Standing Expiration Date:   03/01/2024   Lactate dehydrogenase    Standing Status:   Future    Standing Expiration Date:   03/01/2024   Follow-up in 12 months All questions were answered. The patient knows to call the clinic with any problems, questions or concerns.  Rickard Patience, MD, PhD [facility] Hematology Oncology 03/02/2023       CHIEF  COMPLAINTS/REASON FOR VISIT:  Evaluation of decreased white count  HISTORY OF PRESENTING ILLNESS:   Sergio Hosbach. is a  52 y.o.  male with PMH listed below was seen in consultation at the request of  Luciana Axe, NP  for evaluation of decreased white count  Patient was recently seen by primary care provider. 11/22/2021, CBC showed decreased WBC 3.8, hemoglobin 13.2, MCV 109.2, normal absolute lymphocyte, absolute neutrophil, increased monocyte and eosinophil percentage Ferritin level 930, iron 201, TIBC, 231, TSH 2.72, vitamin B12 929, amylase 47, lipase 71, sodium 133, potassium 4.1, creatinine 0.8, calcium 8.5, AST 85, ALT 51, alkaline phosphatase 143, albumin 2.9, bilirubin 3.8.  Patient was accompanied by his wife today.  Per wife, there is some unintentional weight loss, yellow tint to his skin,  early satiety, decreased appetite.  Patient drinks 2 to 3 cans of beer every day.  I reviewed patient's previous lab records.  Patient has had elevated bilirubin and elevated liver enzymes intermittently since at least 2021. Patient was previously seen by [facility] clinic GI.  12/10/2019, right upper quadrant ultrasound abdomen showed no focal lesion identified in the liver, parenchymal echogenicity is within normal limits.  Portal vein is patent.  Denies fever chills, abdominal pain, frequent infection.  Denies family history of cancer.  INTERVAL HISTORY Sergio Weeks. is a 52 y.o. male who has above history reviewed by me today presents for follow up visit for macrocytic anemia, elevated indirect bilirubin.  He has no new complaints. Denies weight loss, fever,  chills, fatigue, night sweats.  He follows up with GI for liver cirrhosis.  Drinks beer 1-2 time per week.    Review of Systems  Constitutional:  Negative for appetite change, chills, fatigue, fever and unexpected weight change.  HENT:   Negative for hearing loss and voice change.   Eyes:  Negative for eye problems and  icterus.  Respiratory:  Negative for chest tightness, cough and shortness of breath.   Cardiovascular:  Negative for chest pain and leg swelling.  Gastrointestinal:  Negative for abdominal distention and abdominal pain.  Endocrine: Negative for hot flashes.  Genitourinary:  Negative for difficulty urinating, dysuria and frequency.   Musculoskeletal:  Negative for arthralgias.  Skin:  Negative for itching and rash.  Neurological:  Negative for light-headedness and numbness.  Hematological:  Negative for adenopathy. Does not bruise/bleed easily.  Psychiatric/Behavioral:  Negative for confusion.     MEDICAL HISTORY:  Past Medical History:  Diagnosis Date   Chest pain at rest    headache    Hypertension     SURGICAL HISTORY: Past Surgical History:  Procedure Laterality Date   COLONOSCOPY WITH PROPOFOL N/A 05/12/2022   Procedure: COLONOSCOPY WITH PROPOFOL;  Surgeon: Regis Bill, MD;  Location: [facility] ENDOSCOPY;  Service: Endoscopy;  Laterality: N/A;   ESOPHAGOGASTRODUODENOSCOPY (EGD) WITH PROPOFOL N/A 05/12/2022   Procedure: ESOPHAGOGASTRODUODENOSCOPY (EGD) WITH PROPOFOL;  Surgeon: Regis Bill, MD;  Location: [facility] ENDOSCOPY;  Service: Endoscopy;  Laterality: N/A;   TONSILLECTOMY      SOCIAL HISTORY: Social History   Socioeconomic History   Marital status: Married    Spouse name: Not on file   Number of children: Not on file   Years of education: Not on file   Highest education level: Not on file  Occupational History   Not on file  Tobacco Use   Smoking status: Never   Smokeless tobacco: Never  Vaping Use   Vaping status: Never Used  Substance and Sexual Activity   Alcohol use: Yes    Comment: 2-3 beer daily.   Drug use: Never   Sexual activity: Not on file  Other Topics Concern   Not on file  Social History Narrative   Not on file   Social Determinants of Health   Financial Resource Strain: Low Risk  (11/16/2022)   Received from [facility]   Overall Financial Resource Strain (CARDIA)    Difficulty of Paying Living Expenses: Not hard at all  Food Insecurity: No Food Insecurity (11/16/2022)   Received from [facility]   Hunger Vital Sign    Worried About Running Out of Food in the Last Year: Never true    Ran Out of Food in the Last Year: Never true  Transportation Needs: No Transportation Needs (11/16/2022)   Received from [facility] - Transportation    In the past 12 months, has lack of transportation kept you from medical appointments or from getting medications?: No    Lack of Transportation (Non-Medical): No  Physical Activity: Not on file  Stress: Not on file  Social Connections: Not on file  Intimate Partner Violence: Not on file    FAMILY HISTORY: Family History  Problem Relation Age of Onset   Diabetes Father     ALLERGIES:  is allergic to cefaclor and penicillin v.  MEDICATIONS:  Current Outpatient Medications  Medication Sig Dispense Refill   Cholecalciferol 50 MCG (2000 UT) CAPS Take 3 capsules daily for 3  months, then reduce to 1 capsule daily thereafter for Vitamin D Deficiency.     eplerenone (INSPRA) 50 MG tablet Take by mouth.     furosemide (LASIX) 40 MG tablet Take by mouth.     gabapentin (NEURONTIN) 300 MG capsule Take 300 mg by mouth at bedtime.     magnesium oxide (MAG-OX) 400 (240 Mg) MG tablet Take 1 tablet by mouth daily.     Multiple Vitamin (MULTI-VITAMIN) tablet Take 1 tablet by mouth daily.     losartan (COZAAR) 25 MG tablet Take 1 tablet by mouth daily.     No current facility-administered medications for this visit.     PHYSICAL EXAMINATION: ECOG PERFORMANCE STATUS: 1 - Symptomatic but completely ambulatory Vitals:   03/02/23 1019 03/02/23 1021  BP:  112/81  Pulse: 80   Resp: 18   SpO2: 97% 99%    Filed Weights   03/02/23 1019  Weight: 229 lb (103.9 kg)     Physical Exam Constitutional:      General: He  is not in acute distress. HENT:     Head: Normocephalic and atraumatic.  Eyes:     General: No scleral icterus. Cardiovascular:     Rate and Rhythm: Normal rate.  Pulmonary:     Effort: Pulmonary effort is normal. No respiratory distress.     Breath sounds: No wheezing.  Abdominal:     General: Bowel sounds are normal. There is no distension.     Palpations: Abdomen is soft.  Musculoskeletal:        General: No deformity. Normal range of motion.     Cervical back: Normal range of motion and neck supple.  Skin:    General: Skin is warm and dry.  Neurological:     Mental Status: He is alert and oriented to person, place, and time. Mental status is at baseline.     Cranial Nerves: No cranial nerve deficit.  Psychiatric:        Mood and Affect: Mood normal.     LABORATORY DATA:  I have reviewed the data as listed Lab Results  Component Value Date   WBC 4.5 03/02/2023   HGB 13.5 03/02/2023   HCT 38.6 (L) 03/02/2023   MCV 107.2 (H) 03/02/2023   PLT 145 (L) 03/02/2023   Recent Labs    08/16/22 1420 03/02/23 0935 03/02/23 1044  NA 131* 134*  --   K 3.7 3.8  --   CL 99 102  --   CO2 25 27  --   GLUCOSE 134* 120*  --   BUN 12 10  --   CREATININE 0.70 0.74  --   CALCIUM 8.4* 8.2*  --   GFRNONAA >60 >60  --   PROT 6.9 7.3  --   ALBUMIN 2.6* 2.6*  --   AST 81* 69*  --   ALT 46* 40  --   ALKPHOS 109 124  --   BILITOT 6.4* 5.1*  --   BILIDIR  --   --  1.4*   Iron/TIBC/Ferritin/ %Sat    Component Value Date/Time   IRON 216 (H) 08/16/2022 1420   TIBC 252 08/16/2022 1420   FERRITIN 866 (H) 08/16/2022 1420   IRONPCTSAT 86 (H) 08/16/2022 1420      RADIOGRAPHIC STUDIES: I have personally reviewed the radiological images as listed and agreed with the findings in the report. No results found.

## 2023-03-02 NOTE — Assessment & Plan Note (Signed)
Labs reviewed and discussed with patient Hemolysis labs showed decreased haptoglobin, increased LDH indicating peripheral blood destruction.  No anemia, chronic macrocytosis indicating chronic hemolysis.  Negative DAT, normal hemoglobin free plasma, normal G6PD, negative PNH, positive RBC osmotic fragility, I suspect that he has spherocytosis, possible hereditary spherocytosis, or due to chronic alcohol use.  EMA binding is not available at [facility]. He is not interested in NGS testing.  Stable hemoglobin.  Continue monitor.

## 2023-03-02 NOTE — Addendum Note (Signed)
Addended by: Rickard Patience on: 03/02/2023 05:53 PM   Modules accepted: Orders

## 2023-03-02 NOTE — Assessment & Plan Note (Signed)
Hemochromatosis gene mutation screening negative. Elevated ferritin is likely secondary to liver cirrhosis/alcohol use Alcohol cessation was discussed with patient.

## 2023-03-02 NOTE — Telephone Encounter (Signed)
Critical bilirubin : 5.1

## 2023-03-02 NOTE — Assessment & Plan Note (Signed)
Follow-up with GI

## 2023-04-13 ENCOUNTER — Other Ambulatory Visit: Payer: Self-pay | Admitting: Gastroenterology

## 2023-04-13 DIAGNOSIS — K7031 Alcoholic cirrhosis of liver with ascites: Secondary | ICD-10-CM

## 2023-05-08 ENCOUNTER — Ambulatory Visit
Admission: RE | Admit: 2023-05-08 | Discharge: 2023-05-08 | Disposition: A | Discharge status: Home / Self Care | Payer: Managed Care, Other (non HMO) | Source: Ambulatory Visit | Attending: Gastroenterology | Admitting: Gastroenterology

## 2023-05-08 DIAGNOSIS — K7031 Alcoholic cirrhosis of liver with ascites: Secondary | ICD-10-CM | POA: Diagnosis present

## 2023-09-10 ENCOUNTER — Other Ambulatory Visit: Payer: Self-pay | Admitting: Gastroenterology

## 2023-09-10 DIAGNOSIS — F1010 Alcohol abuse, uncomplicated: Secondary | ICD-10-CM

## 2023-09-10 DIAGNOSIS — D689 Coagulation defect, unspecified: Secondary | ICD-10-CM

## 2023-09-10 DIAGNOSIS — K7031 Alcoholic cirrhosis of liver with ascites: Secondary | ICD-10-CM

## 2023-09-10 DIAGNOSIS — K766 Portal hypertension: Secondary | ICD-10-CM

## 2023-10-15 ENCOUNTER — Ambulatory Visit: Payer: Managed Care, Other (non HMO)

## 2023-10-23 IMAGING — MR MR LUMBAR SPINE W/O CM
5 series · 31 of 48 positions shown · non-contrast
Comparison: None.

CLINICAL DATA: Degenerative disc disease, lumbar; lumbar
radiculopathy; technologist note states low back pain radiating into
bilateral legs with numbness and tingling

EXAM:
MRI LUMBAR SPINE WITHOUT CONTRAST
TECHNIQUE: Multiplanar, multisequence MR imaging of the lumbar spine was
performed. No intravenous contrast was administered.

[Series 5: T2 · sagittal · 4.0mm · 0.81mm/px · 6 of 17 slices shown (1 of 2)]
[im 1/17]
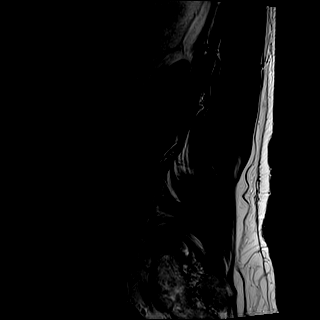
[im 4/17]
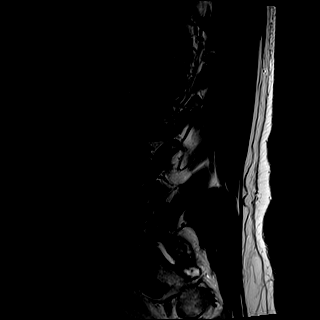
[im 7/17]
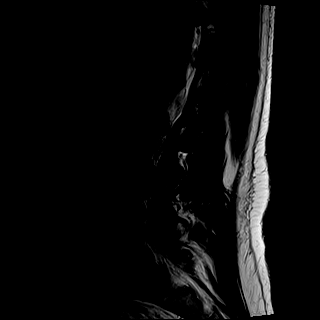
[im 10/17]
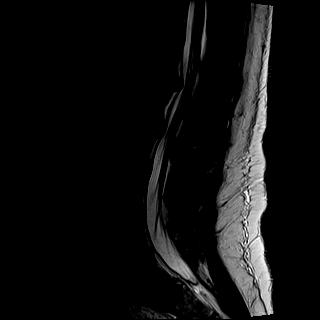
[im 13/17]
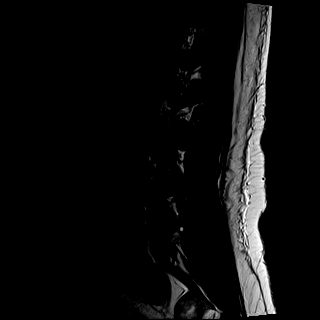
[im 17/17]
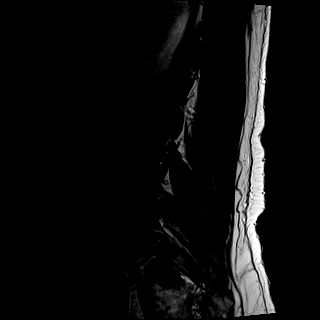

[Series 6: T1 · sagittal · 4.0mm · 0.81mm/px · 6 of 17 slices shown (1 of 2)]
[im 1/17]
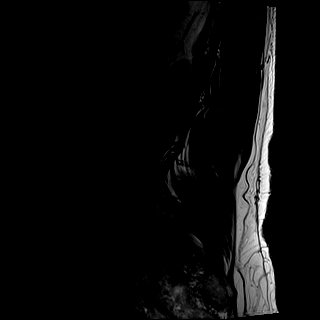
[im 4/17]
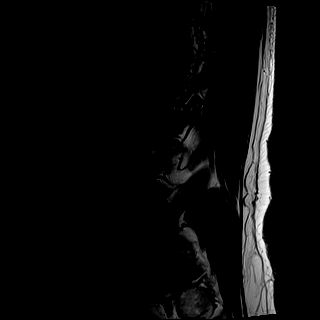
[im 7/17]
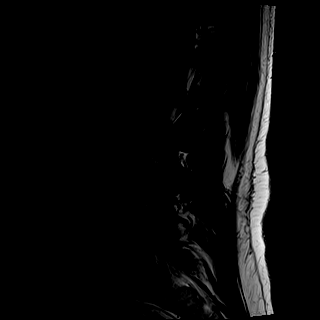
[im 10/17]
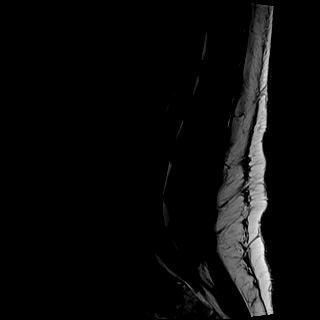
[im 13/17]
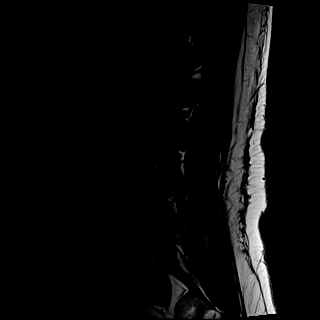
[im 17/17]
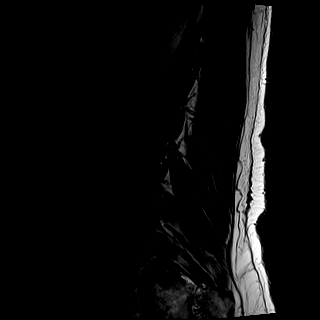

[Series 7: STIR · sagittal · 4.0mm · 0.41mm/px · 1 of 17 slices shown]
[im 1/17]
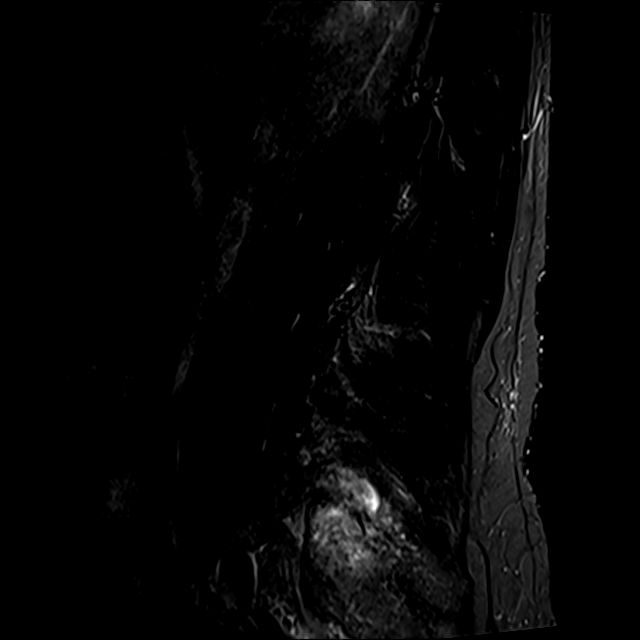

[Series 8: T2 · axial · 4.0mm · 0.78mm/px · z∈[-129,+103]mm · 9 of 39 slices shown (2 of 2)]
[im 1/39]
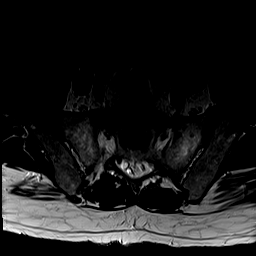
[im 6/39]
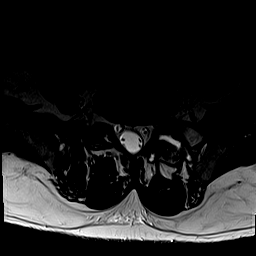
[im 11/39]
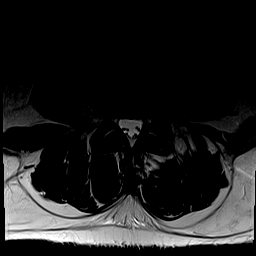
[im 17/39]
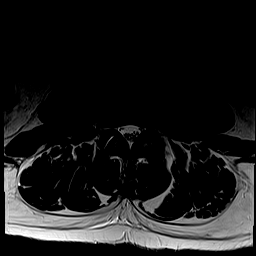
[im 20/39]
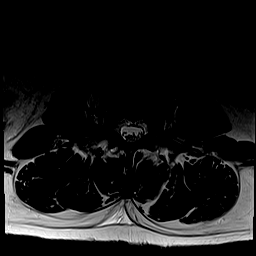
[im 22/39]
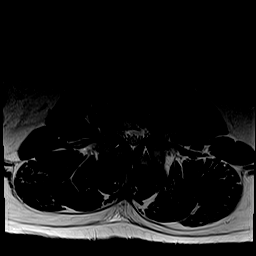
[im 28/39]
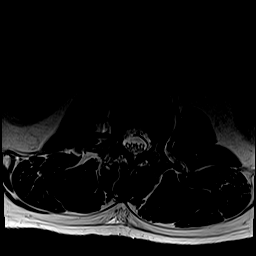
[im 33/39]
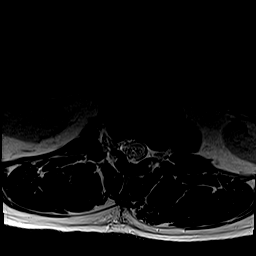
[im 39/39]
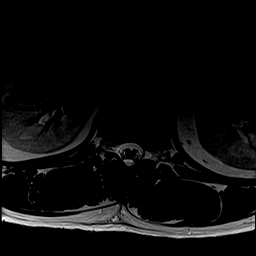

[Series 9: T1 · axial · 4.0mm · 0.39mm/px · z∈[-129,+103]mm · 9 of 39 slices shown (2 of 2)]
[im 1/39]
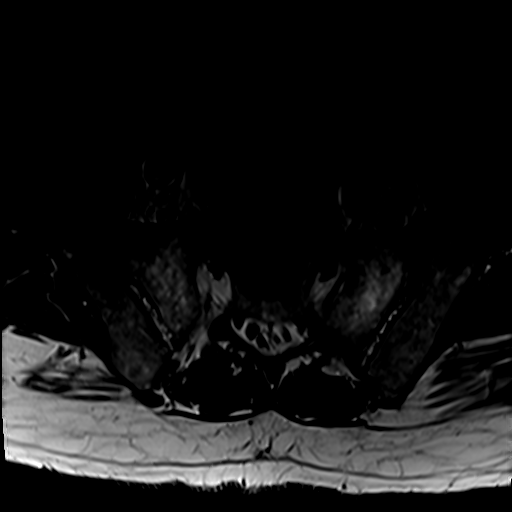
[im 6/39]
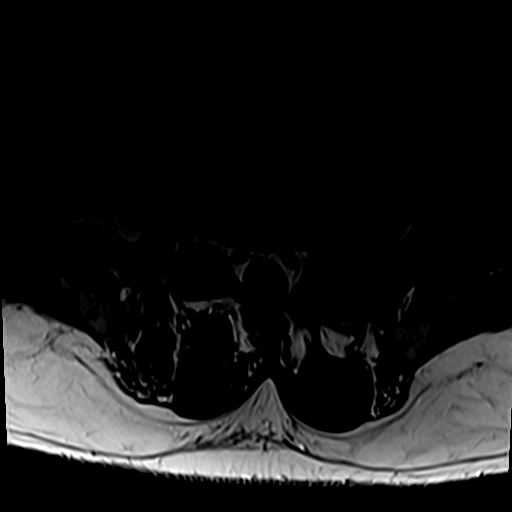
[im 11/39]
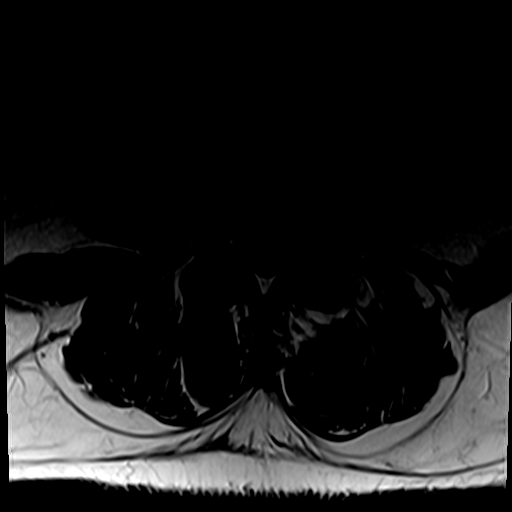
[im 17/39]
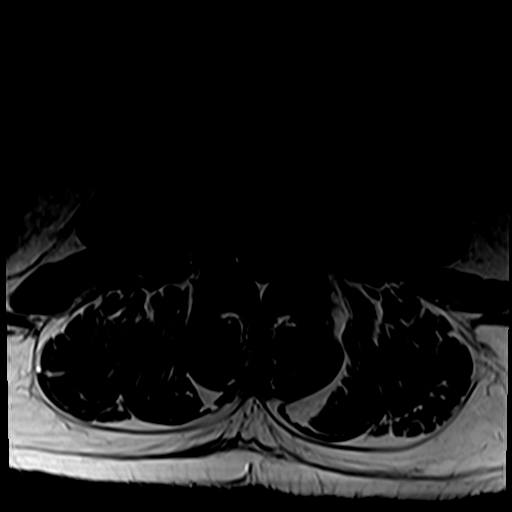
[im 20/39]
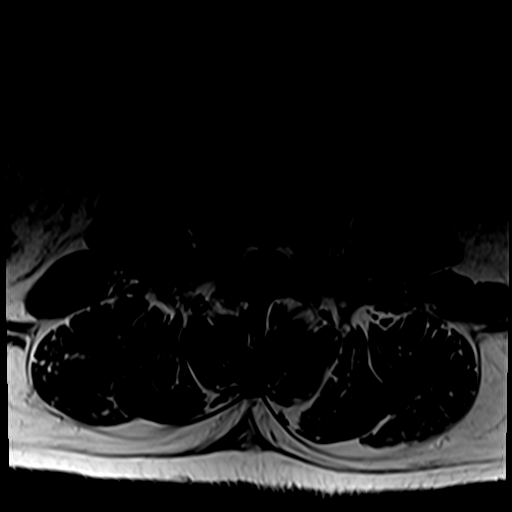
[im 22/39]
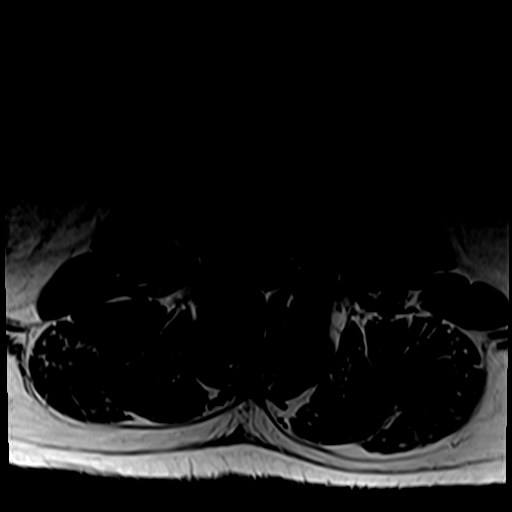
[im 28/39]
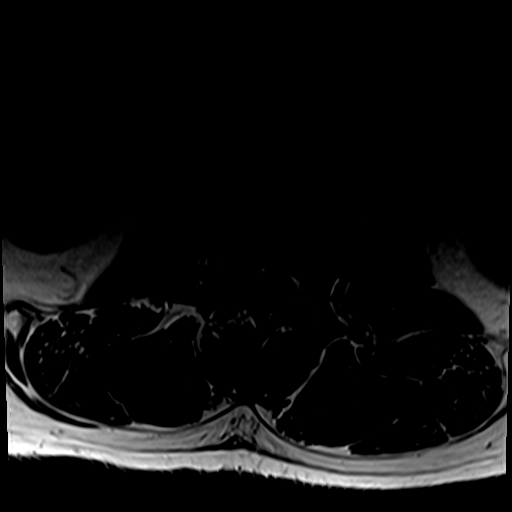
[im 33/39]
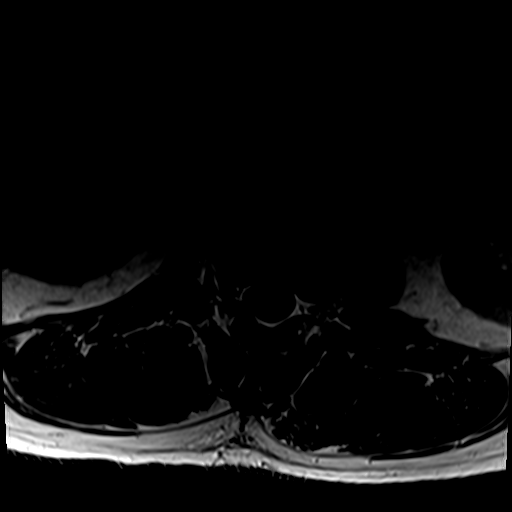
[im 39/39]
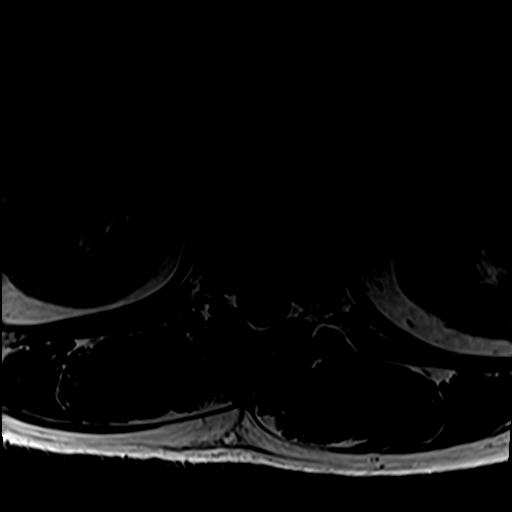

[31 of 48 positions shown; findings below may reference images not displayed]

FINDINGS: Segmentation:  Standard.

Alignment:  Trace retrolisthesis at L1-L2 and L2-L3. levocurvature.

Vertebrae: Mild degenerative endplate marrow edema at L1-L2 and
L2-L3 eccentric to the right. Marrow edema at the left L5 pedicle
and at the left aspect of S1 probably due to left L5-S1 facet
arthropathy.

Conus medullaris and cauda equina: Conus extends to the L1 level.
Conus and cauda equina appear normal.

Paraspinal and other soft tissues: Unremarkable.

Disc levels:

L1-L2: Disc bulge eccentric to the right. Minor canal stenosis.
Partial effacement right subarticular recess. Mild right foraminal
stenosis. No left foraminal stenosis. Disc probably contacts the
extraforaminal right L1 nerve roots.

L2-L3: Disc bulge with left foraminal annular fissure. Minor canal
stenosis. Partial effacement of the subarticular recesses. Mild to
moderate foraminal stenosis. Disc probably contacts the
extraforaminal left greater than right L2 nerve roots.

L3-L4: Disc bulge. No canal stenosis. Slight effacement of the
subarticular recesses. Mild foraminal stenosis.

L4-L5: Disc bulge. No canal stenosis. Minor right greater than left
foraminal stenosis.

L5-S1: Marked left facet arthropathy with joint effusion and
extra-spinal synovial cyst formation. No canal or foraminal
stenosis.
IMPRESSION: Multilevel degenerative changes as detailed above. No high-grade
stenosis. Marked left facet arthropathy at L5-S1 with associated
marrow edema.

## 2023-11-14 ENCOUNTER — Ambulatory Visit
Admission: RE | Admit: 2023-11-14 | Discharge: 2023-11-14 | Disposition: A | Discharge status: Home / Self Care | Source: Ambulatory Visit | Attending: Gastroenterology | Admitting: Gastroenterology

## 2023-11-14 DIAGNOSIS — F1010 Alcohol abuse, uncomplicated: Secondary | ICD-10-CM | POA: Insufficient documentation

## 2023-11-14 DIAGNOSIS — K766 Portal hypertension: Secondary | ICD-10-CM | POA: Diagnosis present

## 2023-11-14 DIAGNOSIS — K7031 Alcoholic cirrhosis of liver with ascites: Secondary | ICD-10-CM | POA: Diagnosis present

## 2023-11-14 DIAGNOSIS — D689 Coagulation defect, unspecified: Secondary | ICD-10-CM | POA: Insufficient documentation

## 2024-02-29 ENCOUNTER — Inpatient Hospital Stay: Payer: Managed Care, Other (non HMO)

## 2024-02-29 ENCOUNTER — Inpatient Hospital Stay: Payer: Managed Care, Other (non HMO) | Admitting: Oncology

## 2024-03-12 ENCOUNTER — Inpatient Hospital Stay: Admitting: Oncology

## 2024-03-12 ENCOUNTER — Inpatient Hospital Stay: Attending: Oncology

## 2024-03-12 ENCOUNTER — Encounter: Payer: Self-pay | Admitting: Oncology

## 2024-03-12 VITALS — BP 116/76 | HR 85 | Temp 97.6°F | Resp 18 | Wt 239.0 lb

## 2024-03-12 DIAGNOSIS — Z881 Allergy status to other antibiotic agents status: Secondary | ICD-10-CM | POA: Insufficient documentation

## 2024-03-12 DIAGNOSIS — R748 Abnormal levels of other serum enzymes: Secondary | ICD-10-CM | POA: Diagnosis not present

## 2024-03-12 DIAGNOSIS — D7589 Other specified diseases of blood and blood-forming organs: Secondary | ICD-10-CM

## 2024-03-12 DIAGNOSIS — K7031 Alcoholic cirrhosis of liver with ascites: Secondary | ICD-10-CM | POA: Diagnosis not present

## 2024-03-12 DIAGNOSIS — K7030 Alcoholic cirrhosis of liver without ascites: Secondary | ICD-10-CM | POA: Diagnosis not present

## 2024-03-12 DIAGNOSIS — Z1589 Genetic susceptibility to other disease: Secondary | ICD-10-CM

## 2024-03-12 DIAGNOSIS — Z833 Family history of diabetes mellitus: Secondary | ICD-10-CM | POA: Insufficient documentation

## 2024-03-12 DIAGNOSIS — Z9089 Acquired absence of other organs: Secondary | ICD-10-CM | POA: Diagnosis not present

## 2024-03-12 DIAGNOSIS — Z880 Allergy status to penicillin: Secondary | ICD-10-CM | POA: Insufficient documentation

## 2024-03-12 DIAGNOSIS — I10 Essential (primary) hypertension: Secondary | ICD-10-CM | POA: Insufficient documentation

## 2024-03-12 DIAGNOSIS — R634 Abnormal weight loss: Secondary | ICD-10-CM | POA: Insufficient documentation

## 2024-03-12 DIAGNOSIS — R6881 Early satiety: Secondary | ICD-10-CM | POA: Diagnosis not present

## 2024-03-12 DIAGNOSIS — Z79899 Other long term (current) drug therapy: Secondary | ICD-10-CM | POA: Insufficient documentation

## 2024-03-12 DIAGNOSIS — R7989 Other specified abnormal findings of blood chemistry: Secondary | ICD-10-CM | POA: Insufficient documentation

## 2024-03-12 LAB — CBC WITH DIFFERENTIAL/PLATELET
Abs Immature Granulocytes: 0.01 K/uL (ref 0.00–0.07)
Basophils Absolute: 0.1 K/uL (ref 0.0–0.1)
Basophils Relative: 2 %
Eosinophils Absolute: 0.1 K/uL (ref 0.0–0.5)
Eosinophils Relative: 3 %
HCT: 38 % — ABNORMAL LOW (ref 39.0–52.0)
Hemoglobin: 13.2 g/dL (ref 13.0–17.0)
Immature Granulocytes: 0 %
Lymphocytes Relative: 18 %
Lymphs Abs: 0.7 K/uL (ref 0.7–4.0)
MCH: 37.5 pg — ABNORMAL HIGH (ref 26.0–34.0)
MCHC: 34.7 g/dL (ref 30.0–36.0)
MCV: 108 fL — ABNORMAL HIGH (ref 80.0–100.0)
Monocytes Absolute: 0.7 K/uL (ref 0.1–1.0)
Monocytes Relative: 17 %
Neutro Abs: 2.3 K/uL (ref 1.7–7.7)
Neutrophils Relative %: 60 %
Platelets: 130 K/uL — ABNORMAL LOW (ref 150–400)
RBC: 3.52 MIL/uL — ABNORMAL LOW (ref 4.22–5.81)
RDW: 14.3 % (ref 11.5–15.5)
WBC: 3.9 K/uL — ABNORMAL LOW (ref 4.0–10.5)
nRBC: 0 % (ref 0.0–0.2)

## 2024-03-12 LAB — BASIC METABOLIC PANEL - CANCER CENTER ONLY
Anion gap: 6 (ref 5–15)
BUN: 13 mg/dL (ref 6–20)
CO2: 25 mmol/L (ref 22–32)
Calcium: 8.4 mg/dL — ABNORMAL LOW (ref 8.9–10.3)
Chloride: 102 mmol/L (ref 98–111)
Creatinine: 0.9 mg/dL (ref 0.61–1.24)
GFR, Estimated: >60 mL/min (ref >60)
Glucose, Bld: 97 mg/dL (ref 70–99)
Potassium: 4.1 mmol/L (ref 3.5–5.1)
Sodium: 133 mmol/L — ABNORMAL LOW (ref 135–145)

## 2024-03-12 LAB — IRON AND TIBC
Iron: 152 ug/dL (ref 45–182)
Saturation Ratios: 57 % — ABNORMAL HIGH (ref 17.9–39.5)
TIBC: 265 ug/dL (ref 250–450)
UIBC: 113 ug/dL

## 2024-03-12 LAB — HEPATIC FUNCTION PANEL
ALT: 44 U/L (ref 0–44)
AST: 77 U/L — ABNORMAL HIGH (ref 15–41)
Albumin: 2.5 g/dL — ABNORMAL LOW (ref 3.5–5.0)
Alkaline Phosphatase: 142 U/L — ABNORMAL HIGH (ref 38–126)
Bilirubin, Direct: 1.7 mg/dL — ABNORMAL HIGH (ref 0.0–0.2)
Indirect Bilirubin: 3.3 mg/dL — ABNORMAL HIGH (ref 0.3–0.9)
Total Bilirubin: 5 mg/dL (ref 0.0–1.2)
Total Protein: 7.2 g/dL (ref 6.5–8.1)

## 2024-03-12 LAB — LACTATE DEHYDROGENASE: LDH: 279 U/L — ABNORMAL HIGH (ref 98–192)

## 2024-03-12 LAB — FERRITIN: Ferritin: 296 ng/mL (ref 24–336)

## 2024-03-12 NOTE — Assessment & Plan Note (Addendum)
 Labs reviewed and discussed with patient Hemolysis labs showed decreased haptoglobin, increased LDH indicating peripheral blood destruction.  No anemia, chronic macrocytosis indicating chronic hemolysis.  Negative DAT, normal hemoglobin free plasma, normal G6PD, negative PNH, positive RBC osmotic fragility, I suspect that he has spherocytosis, possible hereditary spherocytosis, or due to chronic alcohol use.  EMA binding is not available at [facility]. NGS testing can be considered Stable hemoglobin.  Continue monitor.

## 2024-03-12 NOTE — Progress Notes (Signed)
 Patient is doing ok, no new questions for the doctor today.

## 2024-03-12 NOTE — Progress Notes (Signed)
 Hematology/Oncology Progress note [phone] [phone]      Patient Care Team: Alla Amis, MD as PCP - General (Family Medicine) Babara Call, MD as Consulting Physician (Oncology)  REFERRING PROVIDER: Steva Clotilda DEL, NP   ASSESSMENT & PLAN:  Macrocytosis without anemia Labs reviewed and discussed with patient Hemolysis labs showed decreased haptoglobin, increased LDH indicating peripheral blood destruction.  No anemia, chronic macrocytosis indicating chronic hemolysis.  Negative DAT, normal hemoglobin free plasma, normal G6PD, negative PNH, positive RBC osmotic fragility, I suspect that he has spherocytosis, possible hereditary spherocytosis, or due to chronic alcohol use.  EMA binding is not available at [facility]. NGS testing can be considered Stable hemoglobin.  Continue monitor.      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   Elevated ferritin Hemochromatosis gene mutation screening negative. Elevated ferritin is likely secondary to liver cirrhosis/alcohol use Ferritin has decreased as a decrease alcohol consumption Alcohol cessation was discussed with patient. Orders Placed This Encounter  Procedures   CBC with Differential (Cancer Center Only)    Standing Status:   Future    Expected Date:   03/12/2025    Expiration Date:   06/10/2025   Basic Metabolic Panel - Cancer Center Only    Standing Status:   Future    Expected Date:   03/12/2025    Expiration Date:   06/10/2025   Hepatic function panel    Standing Status:   Future    Expected Date:   03/12/2025    Expiration Date:   06/10/2025   Iron and TIBC    Standing Status:   Future    Expected Date:   03/12/2025    Expiration Date:   06/10/2025   Ferritin    Standing Status:   Future    Expected Date:   03/12/2025    Expiration Date:   06/10/2025    Lactate dehydrogenase    Standing Status:   Future    Expected Date:   03/12/2025    Expiration Date:   06/10/2025   Follow-up in 12 months All questions were answered. The patient knows to call the clinic with any problems, questions or concerns.  Call Babara, MD, PhD [facility] Hematology Oncology 03/12/2024       CHIEF COMPLAINTS/REASON FOR VISIT:  Evaluation of decreased white count  HISTORY OF PRESENTING ILLNESS:   Sergio Guedes. is a  53 y.o.  male with PMH listed below was seen in consultation at the request of  Steva Clotilda DEL, NP  for evaluation of decreased white count  Patient was recently seen by primary care provider. 11/22/2021, CBC showed decreased WBC 3.8, hemoglobin 13.2, MCV 109.2, normal absolute lymphocyte, absolute neutrophil, increased monocyte and eosinophil percentage Ferritin level 930, iron 201, TIBC, 231, TSH 2.72, vitamin B12 929, amylase 47, lipase 71, sodium 133, potassium 4.1, creatinine 0.8, calcium 8.5, AST 85, ALT 51, alkaline phosphatase 143, albumin 2.9, bilirubin 3.8.  Patient was accompanied by his wife today.  Per wife, there is some unintentional weight loss, yellow tint to his skin,  early satiety, decreased appetite.  Patient drinks 2 to 3 cans of beer every day.  I reviewed patient's previous lab records.  Patient has had elevated bilirubin and elevated liver enzymes intermittently since at least 2021. Patient was previously seen by [facility] clinic GI.  12/10/2019, right upper quadrant ultrasound abdomen showed  no focal lesion identified in the liver, parenchymal echogenicity is within normal limits.  Portal vein is patent.  Denies fever chills, abdominal pain, frequent infection.  Denies family history of cancer.  INTERVAL HISTORY Sergio Malloy. is a 53 y.o. male who has above history reviewed by me today presents for follow up visit for macrocytic anemia, elevated indirect bilirubin.  He has no new complaints. Denies weight loss,  fever, chills, fatigue, night sweats.  He follows up with GI for liver cirrhosis.  Drinks beer sometimes.  He is going to see hepatology for clearance for his hernia surgery.   Review of Systems  Constitutional:  Negative for appetite change, chills, fatigue, fever and unexpected weight change.  HENT:   Negative for hearing loss and voice change.   Eyes:  Negative for eye problems and icterus.  Respiratory:  Negative for chest tightness, cough and shortness of breath.   Cardiovascular:  Negative for chest pain and leg swelling.  Gastrointestinal:  Negative for abdominal distention and abdominal pain.  Endocrine: Negative for hot flashes.  Genitourinary:  Negative for difficulty urinating, dysuria and frequency.   Musculoskeletal:  Negative for arthralgias.  Skin:  Negative for itching and rash.  Neurological:  Negative for light-headedness and numbness.  Hematological:  Negative for adenopathy. Does not bruise/bleed easily.  Psychiatric/Behavioral:  Negative for confusion.     MEDICAL HISTORY:  Past Medical History:  Diagnosis Date   Chest pain at rest    headache    Hypertension     SURGICAL HISTORY: Past Surgical History:  Procedure Laterality Date   COLONOSCOPY WITH PROPOFOL  N/A 05/12/2022   Procedure: COLONOSCOPY WITH PROPOFOL ;  Surgeon: Maryruth Ole DASEN, MD;  Location: [facility] ENDOSCOPY;  Service: Endoscopy;  Laterality: N/A;   ESOPHAGOGASTRODUODENOSCOPY (EGD) WITH PROPOFOL  N/A 05/12/2022   Procedure: ESOPHAGOGASTRODUODENOSCOPY (EGD) WITH PROPOFOL ;  Surgeon: Maryruth Ole DASEN, MD;  Location: [facility] ENDOSCOPY;  Service: Endoscopy;  Laterality: N/A;   TONSILLECTOMY      SOCIAL HISTORY: Social History   Socioeconomic History   Marital status: Married    Spouse name: Not on file   Number of children: Not on file   Years of education: Not on file   Highest education level: Not on file  Occupational History   Not on file  Tobacco Use   Smoking status: Never    Smokeless tobacco: Never  Vaping Use   Vaping status: Never Used  Substance and Sexual Activity   Alcohol use: Yes    Comment: 2-3 beer daily.   Drug use: Never   Sexual activity: Not on file  Other Topics Concern   Not on file  Social History Narrative   Not on file   Social Drivers of Health   Financial Resource Strain: Low Risk  (07/04/2023)   Received from [facility]   Overall Financial Resource Strain (CARDIA)    Difficulty of Paying Living Expenses: Not hard at all  Food Insecurity: No Food Insecurity (07/04/2023)   Received from [facility]   Hunger Vital Sign    Within the past 12 months, you worried that your food would run out before you got the money to buy more.: Never true    Within the past 12 months, the food you bought just didn't last and you didn't have money to get more.: Never true  Transportation Needs: No Transportation Needs (07/04/2023)   Received from [facility] - Transportation    In  the past 12 months, has lack of transportation kept you from medical appointments or from getting medications?: No    Lack of Transportation (Non-Medical): No  Physical Activity: Not on file  Stress: Not on file  Social Connections: Not on file  Intimate Partner Violence: Not on file    FAMILY HISTORY: Family History  Problem Relation Age of Onset   Diabetes Father     ALLERGIES:  is allergic to cefaclor and penicillin v.  MEDICATIONS:  Current Outpatient Medications  Medication Sig Dispense Refill   Cholecalciferol 50 MCG (2000 UT) CAPS Take 3 capsules daily for 3 months, then reduce to 1 capsule daily thereafter for Vitamin D Deficiency.     eplerenone (INSPRA) 50 MG tablet Take by mouth.     furosemide (LASIX) 40 MG tablet Take by mouth.     gabapentin (NEURONTIN) 300 MG capsule Take 300 mg by mouth at bedtime.     losartan (COZAAR) 25 MG tablet Take 1 tablet by mouth daily.     magnesium  oxide (MAG-OX) 400 (240 Mg) MG tablet Take 1 tablet by mouth daily.     Multiple Vitamin (MULTI-VITAMIN) tablet Take 1 tablet by mouth daily.     No current facility-administered medications for this visit.     PHYSICAL EXAMINATION: ECOG PERFORMANCE STATUS: 1 - Symptomatic but completely ambulatory Vitals:   03/12/24 1011  BP: 116/76  Pulse: 85  Resp: 18  Temp: 97.6 F (36.4 C)  SpO2: 97%    Filed Weights   03/12/24 1011  Weight: 239 lb (108.4 kg)     Physical Exam Constitutional:      General: He is not in acute distress. HENT:     Head: Normocephalic and atraumatic.  Eyes:     General: No scleral icterus. Cardiovascular:     Rate and Rhythm: Normal rate.  Pulmonary:     Effort: Pulmonary effort is normal. No respiratory distress.     Breath sounds: No wheezing.  Abdominal:     General: Bowel sounds are normal. There is no distension.     Palpations: Abdomen is soft.     Comments: Umbilical hernia  Musculoskeletal:        General: No deformity. Normal range of motion.     Cervical back: Normal range of motion and neck supple.  Skin:    General: Skin is warm and dry.  Neurological:     Mental Status: He is alert and oriented to person, place, and time. Mental status is at baseline.     Cranial Nerves: No cranial nerve deficit.  Psychiatric:        Mood and Affect: Mood normal.     LABORATORY DATA:  I have reviewed the data as listed Lab Results  Component Value Date   WBC 3.9 (L) 03/12/2024   HGB 13.2 03/12/2024   HCT 38.0 (L) 03/12/2024   MCV 108.0 (H) 03/12/2024   PLT 130 (L) 03/12/2024   Recent Labs    03/12/24 0951  NA 133*  K 4.1  CL 102  CO2 25  GLUCOSE 97  BUN 13  CREATININE 0.90  CALCIUM 8.4*  GFRNONAA >60  PROT 7.2  ALBUMIN 2.5*  AST 77*  ALT 44  ALKPHOS 142*  BILITOT 5.0*  BILIDIR 1.7*  IBILI 3.3*   Iron/TIBC/Ferritin/ %Sat    Component Value Date/Time   IRON 152 03/12/2024 0951   TIBC 265 03/12/2024 0951   FERRITIN  296 03/12/2024 0951   IRONPCTSAT 57 (H) 03/12/2024  9048      RADIOGRAPHIC STUDIES: I have personally reviewed the radiological images as listed and agreed with the findings in the report. No results found.

## 2024-03-12 NOTE — Assessment & Plan Note (Signed)
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

## 2024-03-12 NOTE — Assessment & Plan Note (Addendum)
 Hemochromatosis gene mutation screening negative. Elevated ferritin is likely secondary to liver cirrhosis/alcohol use Ferritin has decreased as a decrease alcohol consumption Alcohol cessation was discussed with patient.

## 2024-03-12 NOTE — Assessment & Plan Note (Signed)
 Follow-up with GI

## 2025-03-12 ENCOUNTER — Other Ambulatory Visit

## 2025-03-12 ENCOUNTER — Ambulatory Visit: Admitting: Oncology
